# Patient Record
Sex: Female | Born: 1945 | Race: White | Hispanic: No
Health system: Southern US, Community
[De-identification: ages and names within clinical notes are randomized; demographics above are authoritative.]

## PROBLEM LIST (undated history)

## (undated) DIAGNOSIS — K219 Gastro-esophageal reflux disease without esophagitis: Secondary | ICD-10-CM

## (undated) DIAGNOSIS — C189 Malignant neoplasm of colon, unspecified: Secondary | ICD-10-CM

## (undated) DIAGNOSIS — R42 Dizziness and giddiness: Secondary | ICD-10-CM

## (undated) HISTORY — PX: LAPAROSCOPIC RIGHT COLON RESECTION: SHX1935

## (undated) HISTORY — PX: ABDOMINAL SURGERY: SHX537

## (undated) HISTORY — PX: TUBAL LIGATION: SHX77

## (undated) HISTORY — DX: Gastro-esophageal reflux disease without esophagitis: K21.9

## (undated) HISTORY — PX: COLON SURGERY: SHX602

## (undated) HISTORY — DX: Malignant neoplasm of colon, unspecified: C18.9

## (undated) HISTORY — DX: Dizziness and giddiness: R42

---

## 2000-02-10 ENCOUNTER — Other Ambulatory Visit: Admission: RE | Admit: 2000-02-10 | Discharge: 2000-02-10 | Payer: Self-pay | Admitting: Obstetrics and Gynecology

## 2000-04-22 ENCOUNTER — Ambulatory Visit (HOSPITAL_COMMUNITY): Admission: RE | Admit: 2000-04-22 | Discharge: 2000-04-22 | Payer: Self-pay | Admitting: Obstetrics and Gynecology

## 2000-07-30 ENCOUNTER — Ambulatory Visit (HOSPITAL_COMMUNITY): Admission: RE | Admit: 2000-07-30 | Discharge: 2000-07-30 | Payer: Self-pay | Admitting: Obstetrics and Gynecology

## 2000-07-30 ENCOUNTER — Encounter (INDEPENDENT_AMBULATORY_CARE_PROVIDER_SITE_OTHER): Payer: Self-pay

## 2001-03-15 ENCOUNTER — Ambulatory Visit (HOSPITAL_COMMUNITY): Admission: RE | Admit: 2001-03-15 | Discharge: 2001-03-15 | Payer: Self-pay | Admitting: Obstetrics and Gynecology

## 2001-03-15 ENCOUNTER — Encounter: Payer: Self-pay | Admitting: Obstetrics and Gynecology

## 2001-04-04 ENCOUNTER — Other Ambulatory Visit: Admission: RE | Admit: 2001-04-04 | Discharge: 2001-04-04 | Payer: Self-pay | Admitting: Obstetrics and Gynecology

## 2001-04-14 ENCOUNTER — Encounter: Admission: RE | Admit: 2001-04-14 | Discharge: 2001-04-14 | Payer: Self-pay | Admitting: Obstetrics and Gynecology

## 2001-04-14 ENCOUNTER — Encounter: Payer: Self-pay | Admitting: Obstetrics and Gynecology

## 2002-09-01 ENCOUNTER — Other Ambulatory Visit: Admission: RE | Admit: 2002-09-01 | Discharge: 2002-09-01 | Payer: Self-pay | Admitting: Obstetrics and Gynecology

## 2003-01-30 ENCOUNTER — Ambulatory Visit (HOSPITAL_COMMUNITY): Admission: RE | Admit: 2003-01-30 | Discharge: 2003-01-30 | Payer: Self-pay | Admitting: Obstetrics and Gynecology

## 2003-01-30 ENCOUNTER — Encounter: Payer: Self-pay | Admitting: Obstetrics and Gynecology

## 2003-10-16 ENCOUNTER — Other Ambulatory Visit: Admission: RE | Admit: 2003-10-16 | Discharge: 2003-10-16 | Payer: Self-pay | Admitting: Obstetrics and Gynecology

## 2004-05-13 ENCOUNTER — Ambulatory Visit (HOSPITAL_COMMUNITY): Admission: RE | Admit: 2004-05-13 | Discharge: 2004-05-13 | Payer: Self-pay | Admitting: Obstetrics and Gynecology

## 2006-05-13 ENCOUNTER — Encounter: Admission: RE | Admit: 2006-05-13 | Discharge: 2006-05-13 | Payer: Self-pay | Admitting: Obstetrics and Gynecology

## 2007-05-01 ENCOUNTER — Emergency Department (HOSPITAL_COMMUNITY): Admission: EM | Admit: 2007-05-01 | Discharge: 2007-05-01 | Payer: Self-pay | Admitting: Family Medicine

## 2010-12-28 ENCOUNTER — Encounter: Payer: Self-pay | Admitting: Obstetrics and Gynecology

## 2011-04-24 NOTE — Op Note (Signed)
[facility]  Patient:    Julie Luna, Julie Luna                       MRN: [id number] Proc. Date: 07/30/00 Adm. Date:  [id number] Disc. Date: [id number] Attending:  Lenoard Aden CC:         [facility]   Operative Report  PREOPERATIVE DIAGNOSIS:           Postmenopausal bleeding with endometrial mass on saline sonohysterography.  POSTOPERATIVE DIAGNOSIS:          Multiple endometrial polyps and submucous fibroid.  OPERATION/PROCEDURE:              1. Diagnostic hysteroscopy.                                   2. Dilatation and curettage.                                   3. Resectoscopic polypectomy.                                   4. Resectoscopic myomectomy.  SURGEON:                          Lenoard Aden, M.D.  ANESTHESIA:                       General by J. Leilani Able, Montez Hageman., M.D.  ESTIMATED BLOOD LOSS:             About 50 cc.  FLUID DEFICIT:                    Deficit 500 cc.  COMPLICATIONS:                    None.  COUNTS:                           Correct.  CONDITION:                        The patient was taken to the recovery room in good condition.  DESCRIPTION OF PROCEDURE:         After being appraised of the risks of anesthesia including infection, bleeding, uterine perforation with need for repair, injury to abdominal organs and need for repair, the patient was brought to the operating room and administered general anesthesia without complications and prepped and draped in the usual sterile fashion.  The patient had been catheterized and still bladder was empty.  Examined under anesthesia revealed laminaria which was removed.  There was anteflex uterus with grade 2 cystocele and grade 1 uterine descencus.  At this time dilute Pitressin solution 100 cc was placed at 3 and 9 oclock in the cervicovaginal junction.  The cervix was easily dilated up to a #31 Pratt dilator.  The hysteroscope was placed and  visualization revealed multiple anterior wall probable endometrial polyps and a posterior wall mass that impinges upon the fundus with consistency of a submucous fibroid.  The right angle loupe was entered and resection of the anterior wall mass done  without difficulty.  Good hemostasis was achieved.  Attention was turned to the posterior wall fibroid, which was removed in two passes using the right angle loupe.  At this time good hemostasis was achieved.  There was noted to be 500 cc fluid deficit; however, the drape was poorly applied to the patients buttocks and the sheet under the patient appears to be wet, accounting for some deficit.  The patient had no problems during anemia.  She was awakened and transferred to recovery in good condition. DD:  07/30/00 TD:  08/01/00 Job: [id number]

## 2011-09-14 ENCOUNTER — Other Ambulatory Visit: Payer: Self-pay | Admitting: Obstetrics and Gynecology

## 2011-09-14 DIAGNOSIS — Z1231 Encounter for screening mammogram for malignant neoplasm of breast: Secondary | ICD-10-CM

## 2011-10-22 ENCOUNTER — Ambulatory Visit
Admission: RE | Admit: 2011-10-22 | Discharge: 2011-10-22 | Disposition: A | Discharge status: Home / Self Care | Payer: BC Managed Care – PPO | Source: Ambulatory Visit | Attending: Obstetrics and Gynecology | Admitting: Obstetrics and Gynecology

## 2011-10-22 DIAGNOSIS — Z1231 Encounter for screening mammogram for malignant neoplasm of breast: Secondary | ICD-10-CM

## 2013-01-11 ENCOUNTER — Other Ambulatory Visit (INDEPENDENT_AMBULATORY_CARE_PROVIDER_SITE_OTHER): Payer: Self-pay | Admitting: Otolaryngology

## 2013-01-11 DIAGNOSIS — H9120 Sudden idiopathic hearing loss, unspecified ear: Secondary | ICD-10-CM

## 2013-01-16 ENCOUNTER — Ambulatory Visit
Admission: RE | Admit: 2013-01-16 | Discharge: 2013-01-16 | Disposition: A | Discharge status: Home / Self Care | Payer: BC Managed Care – PPO | Source: Ambulatory Visit | Attending: Otolaryngology | Admitting: Otolaryngology

## 2013-01-16 DIAGNOSIS — H9120 Sudden idiopathic hearing loss, unspecified ear: Secondary | ICD-10-CM

## 2013-01-16 MED ORDER — GADOBENATE DIMEGLUMINE 529 MG/ML IV SOLN
15.0000 mL | Freq: Once | INTRAVENOUS | Status: AC | PRN
Start: 2013-01-16 — End: 2013-01-16
  Administered 2013-01-16: 15 mL via INTRAVENOUS

## 2013-07-19 ENCOUNTER — Other Ambulatory Visit: Payer: Self-pay

## 2013-07-19 DIAGNOSIS — Z1231 Encounter for screening mammogram for malignant neoplasm of breast: Secondary | ICD-10-CM

## 2013-08-09 ENCOUNTER — Ambulatory Visit
Admission: RE | Admit: 2013-08-09 | Discharge: 2013-08-09 | Disposition: A | Discharge status: Home / Self Care | Payer: BC Managed Care – PPO | Source: Ambulatory Visit

## 2013-08-09 DIAGNOSIS — Z1231 Encounter for screening mammogram for malignant neoplasm of breast: Secondary | ICD-10-CM

## 2014-09-12 ENCOUNTER — Other Ambulatory Visit: Payer: Self-pay

## 2014-09-12 DIAGNOSIS — Z1231 Encounter for screening mammogram for malignant neoplasm of breast: Secondary | ICD-10-CM

## 2014-10-17 ENCOUNTER — Ambulatory Visit
Admission: RE | Admit: 2014-10-17 | Discharge: 2014-10-17 | Disposition: A | Discharge status: Home / Self Care | Payer: Medicare Other | Source: Ambulatory Visit

## 2014-10-17 DIAGNOSIS — Z1231 Encounter for screening mammogram for malignant neoplasm of breast: Secondary | ICD-10-CM

## 2015-09-11 ENCOUNTER — Other Ambulatory Visit: Payer: Self-pay

## 2015-09-11 DIAGNOSIS — Z1231 Encounter for screening mammogram for malignant neoplasm of breast: Secondary | ICD-10-CM

## 2015-10-25 ENCOUNTER — Other Ambulatory Visit: Payer: Self-pay | Admitting: Obstetrics and Gynecology

## 2015-10-25 DIAGNOSIS — E2839 Other primary ovarian failure: Secondary | ICD-10-CM

## 2015-11-06 ENCOUNTER — Ambulatory Visit
Admission: RE | Admit: 2015-11-06 | Discharge: 2015-11-06 | Disposition: A | Discharge status: Home / Self Care | Payer: Medicare Other | Source: Ambulatory Visit

## 2015-11-06 DIAGNOSIS — Z1231 Encounter for screening mammogram for malignant neoplasm of breast: Secondary | ICD-10-CM

## 2015-12-03 ENCOUNTER — Other Ambulatory Visit: Payer: Self-pay

## 2015-12-05 ENCOUNTER — Ambulatory Visit
Admission: RE | Admit: 2015-12-05 | Discharge: 2015-12-05 | Disposition: A | Discharge status: Home / Self Care | Payer: Medicare Other | Source: Ambulatory Visit | Attending: Obstetrics and Gynecology | Admitting: Obstetrics and Gynecology

## 2015-12-05 DIAGNOSIS — E2839 Other primary ovarian failure: Secondary | ICD-10-CM

## 2016-02-28 DIAGNOSIS — E785 Hyperlipidemia, unspecified: Secondary | ICD-10-CM | POA: Diagnosis not present

## 2016-02-28 DIAGNOSIS — I10 Essential (primary) hypertension: Secondary | ICD-10-CM | POA: Diagnosis not present

## 2016-02-28 DIAGNOSIS — F419 Anxiety disorder, unspecified: Secondary | ICD-10-CM | POA: Diagnosis not present

## 2016-02-28 DIAGNOSIS — R7309 Other abnormal glucose: Secondary | ICD-10-CM | POA: Diagnosis not present

## 2016-03-11 ENCOUNTER — Ambulatory Visit (INDEPENDENT_AMBULATORY_CARE_PROVIDER_SITE_OTHER): Payer: Medicare Other | Admitting: Podiatry

## 2016-03-11 VITALS — BP 124/78 | HR 62 | Resp 16

## 2016-03-11 DIAGNOSIS — B351 Tinea unguium: Secondary | ICD-10-CM | POA: Diagnosis not present

## 2016-03-11 MED ORDER — TERBINAFINE HCL 250 MG PO TABS: ORAL_TABLET | ORAL | Status: DC

## 2016-03-11 NOTE — Progress Notes (Signed)
Subjective:     Patient ID: Margreta JourneyDebra T Voight, female   DOB: 07-02-1946, 70 y.o.   MRN: [id number]  HPI patient presents stating that she has a lot of trouble with her big toenails on both feet her third nail on her right foot and that the big toenails are very yellow and a been present for a fairly long time but should not remember specific injury   Review of Systems  All other systems reviewed and are negative.      Objective:   Physical Exam  Constitutional: She is oriented to person, place, and time.  Cardiovascular: Intact distal pulses.   Musculoskeletal: Normal range of motion.  Neurological: She is oriented to person, place, and time.  Skin: Skin is warm.  Nursing note and vitals reviewed.  neurovascular status intact muscle strength adequate range of motion within normal limits with patient found to have thick and yellow brittle nails of the hallux bilateral that extends into the nail base. 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 Discussed treatment options at this point I have recommended combination of pulse Lamisil laser topical treatments. I educated her there is no guarantee this will solve her problem and she wants to undergo this treatment protocol and is scheduled for laser and we'll begin the oral medication and topical medicines

## 2016-03-11 NOTE — Progress Notes (Signed)
   Subjective:    Patient ID: Julie JourneyDebra T Hollinshead, female    DOB: [birth year], 70 y.o.   MRN: [id number]  HPI    Review of Systems  All other systems reviewed and are negative.      Objective:   Physical Exam        Assessment & Plan:

## 2016-03-19 ENCOUNTER — Ambulatory Visit: Payer: Medicare Other

## 2016-03-19 DIAGNOSIS — B351 Tinea unguium: Secondary | ICD-10-CM

## 2016-03-19 NOTE — Progress Notes (Signed)
   Subjective:    Patient ID: Julie JourneyDebra T Hammar, female    DOB: [birth year], 70 y.o.   MRN: [id number]  HPI  Pt presents with mycotic nail infection of Rt 1,3,4 and Lt 1, 5, she is here today for Laser treatment #1  Review of Systems    all other systems negative Objective:   Physical Exam    Mycotic nail infection bilateral     Assessment & Plan:  Laser administered today approximately 2000 pulses to affected nails. Tolerated well. All safety precautions in place. Re-appointed in 1 month for 2nd laser treatment

## 2016-04-01 DIAGNOSIS — L820 Inflamed seborrheic keratosis: Secondary | ICD-10-CM | POA: Diagnosis not present

## 2016-04-08 DIAGNOSIS — H7201 Central perforation of tympanic membrane, right ear: Secondary | ICD-10-CM | POA: Diagnosis not present

## 2016-04-08 DIAGNOSIS — H6123 Impacted cerumen, bilateral: Secondary | ICD-10-CM | POA: Diagnosis not present

## 2016-04-08 DIAGNOSIS — H9011 Conductive hearing loss, unilateral, right ear, with unrestricted hearing on the contralateral side: Secondary | ICD-10-CM | POA: Diagnosis not present

## 2016-04-16 ENCOUNTER — Ambulatory Visit (INDEPENDENT_AMBULATORY_CARE_PROVIDER_SITE_OTHER): Payer: Medicare Other

## 2016-04-16 DIAGNOSIS — B351 Tinea unguium: Secondary | ICD-10-CM | POA: Diagnosis not present

## 2016-04-16 LAB — HEPATIC FUNCTION PANEL
ALK PHOS: 46 U/L (ref 33–130)
ALT: 10 U/L (ref 6–29)
AST: 12 U/L (ref 10–35)
Albumin: 4.1 g/dL (ref 3.6–5.1)
BILIRUBIN DIRECT: 0.1 mg/dL (ref <=0.2)
BILIRUBIN TOTAL: 0.6 mg/dL (ref 0.2–1.2)
Indirect Bilirubin: 0.5 mg/dL (ref 0.2–1.2)
Total Protein: 6.2 g/dL (ref 6.1–8.1)

## 2016-04-16 NOTE — Progress Notes (Signed)
   Subjective:    Patient ID: Julie Luna, female    DOB: 1946-04-18, 70 y.o.   MRN: [id number]  HPI  Pt presents with mycotic nail infection of Rt 1,3,4 and Lt 1, 5, she is here today for Laser treatment #1  Review of Systems    all other systems negative Objective:   Physical Exam    Mycotic nail infection bilateral     Assessment & Plan:  Laser administered today approximately 2000 pulses to affected nails.  Tolerated well. All safety precautions in place. Hepatic fx panel ordered today. Re-appointed in 2 months for 3rd laser treatment

## 2016-04-17 ENCOUNTER — Telehealth: Payer: Self-pay | Admitting: *Deleted

## 2016-04-17 NOTE — Telephone Encounter (Signed)
Dr. Charlsie Merlesegal reviewed 04/16/2016 Hepatic function as normal and pt can take her medication as directed.  Left message informing pt of orders.

## 2016-05-01 DIAGNOSIS — H02834 Dermatochalasis of left upper eyelid: Secondary | ICD-10-CM | POA: Diagnosis not present

## 2016-05-01 DIAGNOSIS — H04123 Dry eye syndrome of bilateral lacrimal glands: Secondary | ICD-10-CM | POA: Diagnosis not present

## 2016-05-01 DIAGNOSIS — H02831 Dermatochalasis of right upper eyelid: Secondary | ICD-10-CM | POA: Diagnosis not present

## 2016-05-01 DIAGNOSIS — H25813 Combined forms of age-related cataract, bilateral: Secondary | ICD-10-CM | POA: Diagnosis not present

## 2016-06-18 ENCOUNTER — Other Ambulatory Visit (INDEPENDENT_AMBULATORY_CARE_PROVIDER_SITE_OTHER): Payer: Medicare Other

## 2016-06-18 DIAGNOSIS — B351 Tinea unguium: Secondary | ICD-10-CM

## 2016-07-30 ENCOUNTER — Ambulatory Visit: Payer: Medicare Other

## 2016-07-30 DIAGNOSIS — B351 Tinea unguium: Secondary | ICD-10-CM

## 2016-07-30 NOTE — Progress Notes (Signed)
   Subjective:    Patient ID: Margreta JourneyDebra T Maricle, female    DOB: 12-24-1945, 70 y.o.   MRN: [id number]  HPI  Pt presents with mycotic nail infection of Rt 1,3, and Lt 1, ,she is here today for Laser treatment #4  Review of Systems    all other systems negative Objective:   Physical Exam    Mycotic nail infection bilateral with distal tip involvement. Clearing at 90%     Assessment & Plan:  Laser administered today approximately 2000 pulses to affected nails.  Tolerated well. All safety precautions in place. Re-appointed as needed

## 2016-09-01 DIAGNOSIS — Z1211 Encounter for screening for malignant neoplasm of colon: Secondary | ICD-10-CM | POA: Diagnosis not present

## 2016-09-01 DIAGNOSIS — Z23 Encounter for immunization: Secondary | ICD-10-CM | POA: Diagnosis not present

## 2016-09-01 DIAGNOSIS — R7309 Other abnormal glucose: Secondary | ICD-10-CM | POA: Diagnosis not present

## 2016-09-01 DIAGNOSIS — E7800 Pure hypercholesterolemia, unspecified: Secondary | ICD-10-CM | POA: Diagnosis not present

## 2016-09-01 DIAGNOSIS — F419 Anxiety disorder, unspecified: Secondary | ICD-10-CM | POA: Diagnosis not present

## 2016-09-01 DIAGNOSIS — I10 Essential (primary) hypertension: Secondary | ICD-10-CM | POA: Diagnosis not present

## 2016-11-11 DIAGNOSIS — H6123 Impacted cerumen, bilateral: Secondary | ICD-10-CM | POA: Diagnosis not present

## 2016-11-11 DIAGNOSIS — H7201 Central perforation of tympanic membrane, right ear: Secondary | ICD-10-CM | POA: Diagnosis not present

## 2016-11-11 DIAGNOSIS — H9011 Conductive hearing loss, unilateral, right ear, with unrestricted hearing on the contralateral side: Secondary | ICD-10-CM | POA: Diagnosis not present

## 2016-11-13 DIAGNOSIS — Z86010 Personal history of colonic polyps: Secondary | ICD-10-CM | POA: Diagnosis not present

## 2017-01-28 DIAGNOSIS — K5730 Diverticulosis of large intestine without perforation or abscess without bleeding: Secondary | ICD-10-CM | POA: Diagnosis not present

## 2017-01-28 DIAGNOSIS — D126 Benign neoplasm of colon, unspecified: Secondary | ICD-10-CM | POA: Diagnosis not present

## 2017-01-28 DIAGNOSIS — Z86010 Personal history of colonic polyps: Secondary | ICD-10-CM | POA: Diagnosis not present

## 2017-01-28 DIAGNOSIS — K648 Other hemorrhoids: Secondary | ICD-10-CM | POA: Diagnosis not present

## 2017-02-02 DIAGNOSIS — D126 Benign neoplasm of colon, unspecified: Secondary | ICD-10-CM | POA: Diagnosis not present

## 2017-02-10 DIAGNOSIS — Z86010 Personal history of colonic polyps: Secondary | ICD-10-CM | POA: Diagnosis not present

## 2017-03-02 DIAGNOSIS — F419 Anxiety disorder, unspecified: Secondary | ICD-10-CM | POA: Diagnosis not present

## 2017-03-02 DIAGNOSIS — I10 Essential (primary) hypertension: Secondary | ICD-10-CM | POA: Diagnosis not present

## 2017-03-02 DIAGNOSIS — R7303 Prediabetes: Secondary | ICD-10-CM | POA: Diagnosis not present

## 2017-03-02 DIAGNOSIS — E7800 Pure hypercholesterolemia, unspecified: Secondary | ICD-10-CM | POA: Diagnosis not present

## 2017-03-23 DIAGNOSIS — H6123 Impacted cerumen, bilateral: Secondary | ICD-10-CM | POA: Diagnosis not present

## 2017-03-23 DIAGNOSIS — H9071 Mixed conductive and sensorineural hearing loss, unilateral, right ear, with unrestricted hearing on the contralateral side: Secondary | ICD-10-CM | POA: Diagnosis not present

## 2017-03-23 DIAGNOSIS — J310 Chronic rhinitis: Secondary | ICD-10-CM | POA: Diagnosis not present

## 2017-04-23 DIAGNOSIS — Z7982 Long term (current) use of aspirin: Secondary | ICD-10-CM | POA: Diagnosis not present

## 2017-04-23 DIAGNOSIS — Z79899 Other long term (current) drug therapy: Secondary | ICD-10-CM | POA: Diagnosis not present

## 2017-04-23 DIAGNOSIS — D122 Benign neoplasm of ascending colon: Secondary | ICD-10-CM | POA: Diagnosis not present

## 2017-04-23 DIAGNOSIS — K635 Polyp of colon: Secondary | ICD-10-CM | POA: Diagnosis not present

## 2017-04-23 DIAGNOSIS — I10 Essential (primary) hypertension: Secondary | ICD-10-CM | POA: Diagnosis not present

## 2017-04-23 DIAGNOSIS — K5730 Diverticulosis of large intestine without perforation or abscess without bleeding: Secondary | ICD-10-CM | POA: Diagnosis not present

## 2017-05-07 DIAGNOSIS — D122 Benign neoplasm of ascending colon: Secondary | ICD-10-CM | POA: Insufficient documentation

## 2017-05-25 DIAGNOSIS — I10 Essential (primary) hypertension: Secondary | ICD-10-CM | POA: Insufficient documentation

## 2017-05-25 DIAGNOSIS — F419 Anxiety disorder, unspecified: Secondary | ICD-10-CM | POA: Insufficient documentation

## 2017-05-25 DIAGNOSIS — J302 Other seasonal allergic rhinitis: Secondary | ICD-10-CM | POA: Insufficient documentation

## 2017-05-25 DIAGNOSIS — D122 Benign neoplasm of ascending colon: Secondary | ICD-10-CM | POA: Diagnosis not present

## 2017-05-25 DIAGNOSIS — K219 Gastro-esophageal reflux disease without esophagitis: Secondary | ICD-10-CM | POA: Insufficient documentation

## 2017-06-21 DIAGNOSIS — K219 Gastro-esophageal reflux disease without esophagitis: Secondary | ICD-10-CM | POA: Diagnosis not present

## 2017-06-21 DIAGNOSIS — I10 Essential (primary) hypertension: Secondary | ICD-10-CM | POA: Diagnosis not present

## 2017-06-21 DIAGNOSIS — E785 Hyperlipidemia, unspecified: Secondary | ICD-10-CM | POA: Diagnosis not present

## 2017-06-21 DIAGNOSIS — F419 Anxiety disorder, unspecified: Secondary | ICD-10-CM | POA: Diagnosis not present

## 2017-06-21 DIAGNOSIS — D122 Benign neoplasm of ascending colon: Secondary | ICD-10-CM | POA: Diagnosis not present

## 2017-06-21 DIAGNOSIS — C182 Malignant neoplasm of ascending colon: Secondary | ICD-10-CM | POA: Diagnosis not present

## 2017-06-21 DIAGNOSIS — H9191 Unspecified hearing loss, right ear: Secondary | ICD-10-CM | POA: Diagnosis not present

## 2017-07-09 DIAGNOSIS — H903 Sensorineural hearing loss, bilateral: Secondary | ICD-10-CM | POA: Diagnosis not present

## 2017-07-09 DIAGNOSIS — H6123 Impacted cerumen, bilateral: Secondary | ICD-10-CM | POA: Diagnosis not present

## 2017-08-17 DIAGNOSIS — C182 Malignant neoplasm of ascending colon: Secondary | ICD-10-CM | POA: Insufficient documentation

## 2017-08-25 DIAGNOSIS — I10 Essential (primary) hypertension: Secondary | ICD-10-CM | POA: Diagnosis not present

## 2017-08-25 DIAGNOSIS — E7800 Pure hypercholesterolemia, unspecified: Secondary | ICD-10-CM | POA: Diagnosis not present

## 2017-08-25 DIAGNOSIS — R7303 Prediabetes: Secondary | ICD-10-CM | POA: Diagnosis not present

## 2017-08-25 DIAGNOSIS — F419 Anxiety disorder, unspecified: Secondary | ICD-10-CM | POA: Diagnosis not present

## 2018-01-11 DIAGNOSIS — H6123 Impacted cerumen, bilateral: Secondary | ICD-10-CM | POA: Diagnosis not present

## 2018-01-11 DIAGNOSIS — H9041 Sensorineural hearing loss, unilateral, right ear, with unrestricted hearing on the contralateral side: Secondary | ICD-10-CM | POA: Diagnosis not present

## 2018-02-09 DIAGNOSIS — C182 Malignant neoplasm of ascending colon: Secondary | ICD-10-CM | POA: Diagnosis not present

## 2018-02-09 DIAGNOSIS — D122 Benign neoplasm of ascending colon: Secondary | ICD-10-CM | POA: Diagnosis not present

## 2018-02-23 DIAGNOSIS — E7800 Pure hypercholesterolemia, unspecified: Secondary | ICD-10-CM | POA: Diagnosis not present

## 2018-02-23 DIAGNOSIS — R7303 Prediabetes: Secondary | ICD-10-CM | POA: Diagnosis not present

## 2018-02-23 DIAGNOSIS — F419 Anxiety disorder, unspecified: Secondary | ICD-10-CM | POA: Diagnosis not present

## 2018-02-23 DIAGNOSIS — I10 Essential (primary) hypertension: Secondary | ICD-10-CM | POA: Diagnosis not present

## 2018-05-26 DIAGNOSIS — L820 Inflamed seborrheic keratosis: Secondary | ICD-10-CM | POA: Diagnosis not present

## 2018-05-26 DIAGNOSIS — L819 Disorder of pigmentation, unspecified: Secondary | ICD-10-CM | POA: Diagnosis not present

## 2018-05-26 DIAGNOSIS — D485 Neoplasm of uncertain behavior of skin: Secondary | ICD-10-CM | POA: Diagnosis not present

## 2018-06-03 DIAGNOSIS — Z9049 Acquired absence of other specified parts of digestive tract: Secondary | ICD-10-CM | POA: Diagnosis not present

## 2018-06-03 DIAGNOSIS — D124 Benign neoplasm of descending colon: Secondary | ICD-10-CM | POA: Diagnosis not present

## 2018-06-03 DIAGNOSIS — K635 Polyp of colon: Secondary | ICD-10-CM | POA: Diagnosis not present

## 2018-06-03 DIAGNOSIS — Z1211 Encounter for screening for malignant neoplasm of colon: Secondary | ICD-10-CM | POA: Diagnosis not present

## 2018-06-03 DIAGNOSIS — Z85038 Personal history of other malignant neoplasm of large intestine: Secondary | ICD-10-CM | POA: Diagnosis not present

## 2018-06-03 DIAGNOSIS — K5730 Diverticulosis of large intestine without perforation or abscess without bleeding: Secondary | ICD-10-CM | POA: Diagnosis not present

## 2018-07-12 DIAGNOSIS — H9041 Sensorineural hearing loss, unilateral, right ear, with unrestricted hearing on the contralateral side: Secondary | ICD-10-CM | POA: Diagnosis not present

## 2018-07-12 DIAGNOSIS — H6123 Impacted cerumen, bilateral: Secondary | ICD-10-CM | POA: Diagnosis not present

## 2018-07-28 ENCOUNTER — Other Ambulatory Visit: Payer: Self-pay | Admitting: Family Medicine

## 2018-07-28 DIAGNOSIS — Z1231 Encounter for screening mammogram for malignant neoplasm of breast: Secondary | ICD-10-CM

## 2018-08-30 ENCOUNTER — Ambulatory Visit
Admission: RE | Admit: 2018-08-30 | Discharge: 2018-08-30 | Disposition: A | Discharge status: Home / Self Care | Payer: Medicare Other | Source: Ambulatory Visit | Attending: Family Medicine | Admitting: Family Medicine

## 2018-08-30 DIAGNOSIS — Z1231 Encounter for screening mammogram for malignant neoplasm of breast: Secondary | ICD-10-CM

## 2018-08-31 DIAGNOSIS — Z23 Encounter for immunization: Secondary | ICD-10-CM | POA: Diagnosis not present

## 2018-08-31 DIAGNOSIS — E7800 Pure hypercholesterolemia, unspecified: Secondary | ICD-10-CM | POA: Diagnosis not present

## 2018-08-31 DIAGNOSIS — R7303 Prediabetes: Secondary | ICD-10-CM | POA: Diagnosis not present

## 2018-08-31 DIAGNOSIS — I10 Essential (primary) hypertension: Secondary | ICD-10-CM | POA: Diagnosis not present

## 2018-09-28 DIAGNOSIS — Z85038 Personal history of other malignant neoplasm of large intestine: Secondary | ICD-10-CM | POA: Diagnosis not present

## 2018-09-28 DIAGNOSIS — C182 Malignant neoplasm of ascending colon: Secondary | ICD-10-CM | POA: Diagnosis not present

## 2018-09-28 DIAGNOSIS — Z9049 Acquired absence of other specified parts of digestive tract: Secondary | ICD-10-CM | POA: Diagnosis not present

## 2018-09-30 DIAGNOSIS — H7201 Central perforation of tympanic membrane, right ear: Secondary | ICD-10-CM | POA: Diagnosis not present

## 2018-09-30 DIAGNOSIS — H9312 Tinnitus, left ear: Secondary | ICD-10-CM | POA: Diagnosis not present

## 2018-09-30 DIAGNOSIS — H6983 Other specified disorders of Eustachian tube, bilateral: Secondary | ICD-10-CM | POA: Diagnosis not present

## 2018-10-11 DIAGNOSIS — H25813 Combined forms of age-related cataract, bilateral: Secondary | ICD-10-CM | POA: Diagnosis not present

## 2018-10-11 DIAGNOSIS — H04123 Dry eye syndrome of bilateral lacrimal glands: Secondary | ICD-10-CM | POA: Diagnosis not present

## 2018-10-11 DIAGNOSIS — H02834 Dermatochalasis of left upper eyelid: Secondary | ICD-10-CM | POA: Diagnosis not present

## 2018-10-11 DIAGNOSIS — H02831 Dermatochalasis of right upper eyelid: Secondary | ICD-10-CM | POA: Diagnosis not present

## 2019-01-10 DIAGNOSIS — H6123 Impacted cerumen, bilateral: Secondary | ICD-10-CM | POA: Diagnosis not present

## 2019-03-08 DIAGNOSIS — E7800 Pure hypercholesterolemia, unspecified: Secondary | ICD-10-CM | POA: Diagnosis not present

## 2019-03-08 DIAGNOSIS — Z0000 Encounter for general adult medical examination without abnormal findings: Secondary | ICD-10-CM | POA: Diagnosis not present

## 2019-03-08 DIAGNOSIS — R7303 Prediabetes: Secondary | ICD-10-CM | POA: Diagnosis not present

## 2019-03-08 DIAGNOSIS — I10 Essential (primary) hypertension: Secondary | ICD-10-CM | POA: Diagnosis not present

## 2019-03-22 DIAGNOSIS — C182 Malignant neoplasm of ascending colon: Secondary | ICD-10-CM | POA: Diagnosis not present

## 2019-03-24 DIAGNOSIS — K219 Gastro-esophageal reflux disease without esophagitis: Secondary | ICD-10-CM | POA: Diagnosis not present

## 2019-03-24 DIAGNOSIS — D122 Benign neoplasm of ascending colon: Secondary | ICD-10-CM | POA: Diagnosis not present

## 2019-03-29 DIAGNOSIS — E7800 Pure hypercholesterolemia, unspecified: Secondary | ICD-10-CM | POA: Diagnosis not present

## 2019-03-29 DIAGNOSIS — I10 Essential (primary) hypertension: Secondary | ICD-10-CM | POA: Diagnosis not present

## 2019-03-29 DIAGNOSIS — R7303 Prediabetes: Secondary | ICD-10-CM | POA: Diagnosis not present

## 2019-07-12 DIAGNOSIS — H9041 Sensorineural hearing loss, unilateral, right ear, with unrestricted hearing on the contralateral side: Secondary | ICD-10-CM | POA: Diagnosis not present

## 2019-07-12 DIAGNOSIS — H6123 Impacted cerumen, bilateral: Secondary | ICD-10-CM | POA: Diagnosis not present

## 2019-09-12 DIAGNOSIS — F419 Anxiety disorder, unspecified: Secondary | ICD-10-CM | POA: Diagnosis not present

## 2019-09-12 DIAGNOSIS — E7800 Pure hypercholesterolemia, unspecified: Secondary | ICD-10-CM | POA: Diagnosis not present

## 2019-09-12 DIAGNOSIS — I10 Essential (primary) hypertension: Secondary | ICD-10-CM | POA: Diagnosis not present

## 2019-09-12 DIAGNOSIS — R7303 Prediabetes: Secondary | ICD-10-CM | POA: Diagnosis not present

## 2019-10-03 DIAGNOSIS — Z23 Encounter for immunization: Secondary | ICD-10-CM | POA: Diagnosis not present

## 2019-10-17 DIAGNOSIS — H02834 Dermatochalasis of left upper eyelid: Secondary | ICD-10-CM | POA: Diagnosis not present

## 2019-10-17 DIAGNOSIS — H04123 Dry eye syndrome of bilateral lacrimal glands: Secondary | ICD-10-CM | POA: Diagnosis not present

## 2019-10-17 DIAGNOSIS — H25813 Combined forms of age-related cataract, bilateral: Secondary | ICD-10-CM | POA: Diagnosis not present

## 2019-10-17 DIAGNOSIS — H02831 Dermatochalasis of right upper eyelid: Secondary | ICD-10-CM | POA: Diagnosis not present

## 2020-01-10 DIAGNOSIS — H6123 Impacted cerumen, bilateral: Secondary | ICD-10-CM | POA: Diagnosis not present

## 2020-02-12 ENCOUNTER — Other Ambulatory Visit: Payer: Self-pay | Admitting: Family Medicine

## 2020-02-12 DIAGNOSIS — Z1231 Encounter for screening mammogram for malignant neoplasm of breast: Secondary | ICD-10-CM

## 2020-03-12 ENCOUNTER — Other Ambulatory Visit: Payer: Self-pay

## 2020-03-12 ENCOUNTER — Ambulatory Visit
Admission: RE | Admit: 2020-03-12 | Discharge: 2020-03-12 | Disposition: A | Discharge status: Home / Self Care | Payer: Medicare Other | Source: Ambulatory Visit | Attending: Family Medicine | Admitting: Family Medicine

## 2020-03-12 DIAGNOSIS — Z1231 Encounter for screening mammogram for malignant neoplasm of breast: Secondary | ICD-10-CM

## 2020-03-21 ENCOUNTER — Other Ambulatory Visit: Payer: Self-pay | Admitting: Family Medicine

## 2020-03-21 DIAGNOSIS — I10 Essential (primary) hypertension: Secondary | ICD-10-CM | POA: Diagnosis not present

## 2020-03-21 DIAGNOSIS — R7303 Prediabetes: Secondary | ICD-10-CM | POA: Diagnosis not present

## 2020-03-21 DIAGNOSIS — Z85038 Personal history of other malignant neoplasm of large intestine: Secondary | ICD-10-CM | POA: Diagnosis not present

## 2020-03-21 DIAGNOSIS — Z0000 Encounter for general adult medical examination without abnormal findings: Secondary | ICD-10-CM | POA: Diagnosis not present

## 2020-03-21 DIAGNOSIS — R102 Pelvic and perineal pain: Secondary | ICD-10-CM

## 2020-03-21 DIAGNOSIS — E7800 Pure hypercholesterolemia, unspecified: Secondary | ICD-10-CM | POA: Diagnosis not present

## 2020-04-08 ENCOUNTER — Ambulatory Visit
Admission: RE | Admit: 2020-04-08 | Discharge: 2020-04-08 | Disposition: A | Discharge status: Home / Self Care | Payer: Medicare Other | Source: Ambulatory Visit | Attending: Family Medicine | Admitting: Family Medicine

## 2020-04-08 ENCOUNTER — Other Ambulatory Visit: Payer: Self-pay

## 2020-04-08 DIAGNOSIS — R109 Unspecified abdominal pain: Secondary | ICD-10-CM

## 2020-04-08 DIAGNOSIS — R102 Pelvic and perineal pain: Secondary | ICD-10-CM

## 2020-04-08 MED ORDER — IOPAMIDOL (ISOVUE-300) INJECTION 61%
100.0000 mL | Freq: Once | INTRAVENOUS | Status: AC | PRN
  Administered 2020-04-08: 100 mL via INTRAVENOUS

## 2020-07-08 IMAGING — MG DIGITAL SCREENING BILATERAL MAMMOGRAM WITH TOMO AND CAD
8 series · 9 of 24 positions shown · non-contrast
Comparison: Previous exam(s).

ACR Breast Density Category a: The breast tissue is almost entirely
fatty.

CLINICAL DATA: Screening.

EXAM:
DIGITAL SCREENING BILATERAL MAMMOGRAM WITH TOMO AND CAD

[R CC synth-2D]
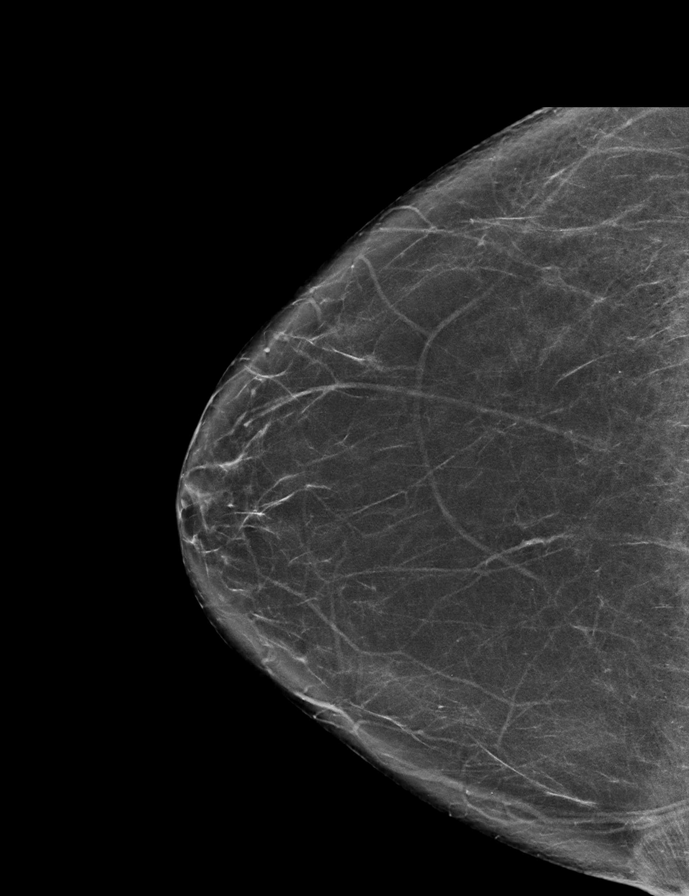

[L MLO synth-2D]
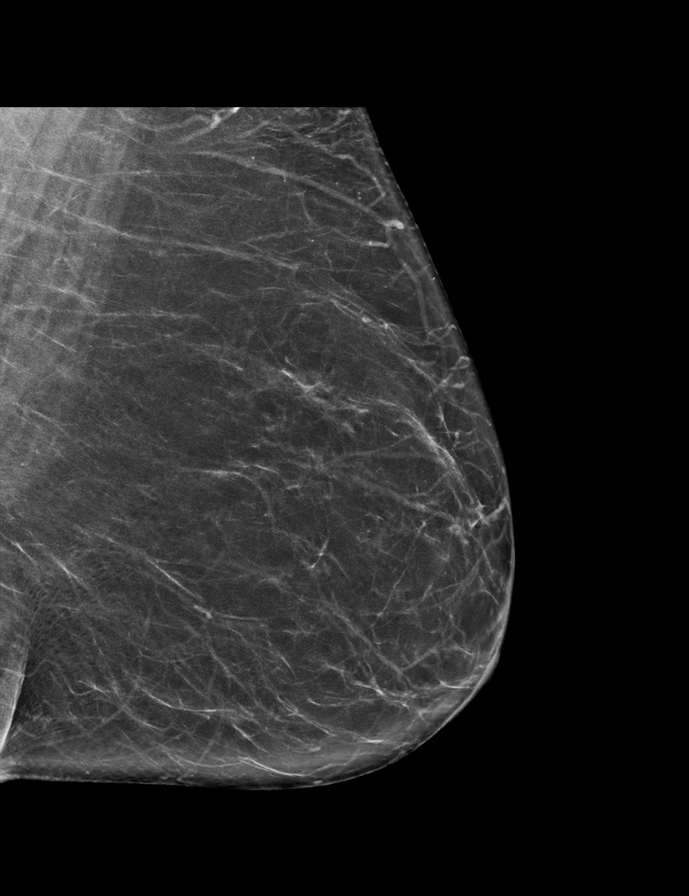

[R MLO synth-2D]
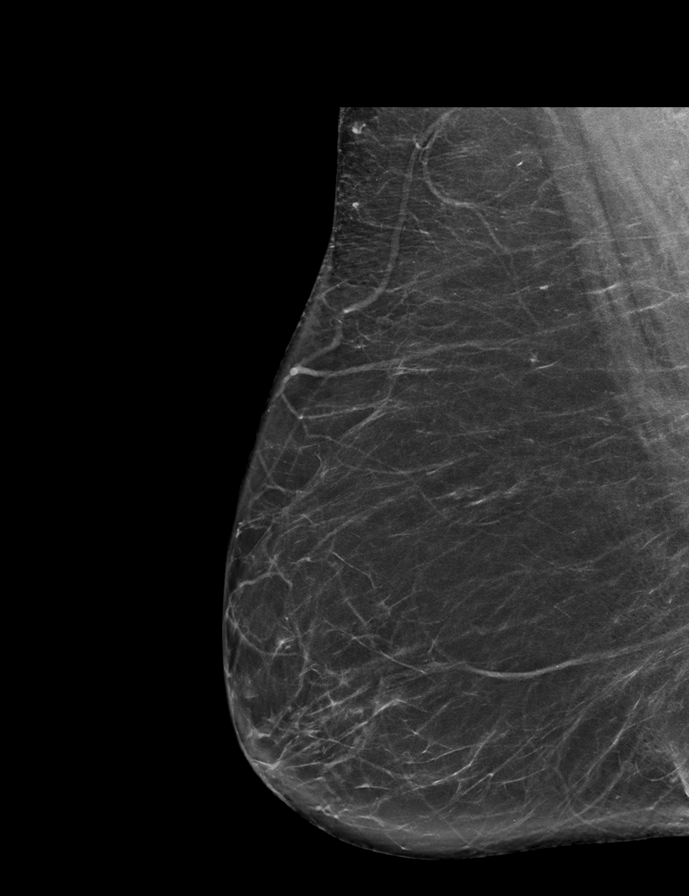

[L CC synth-2D]
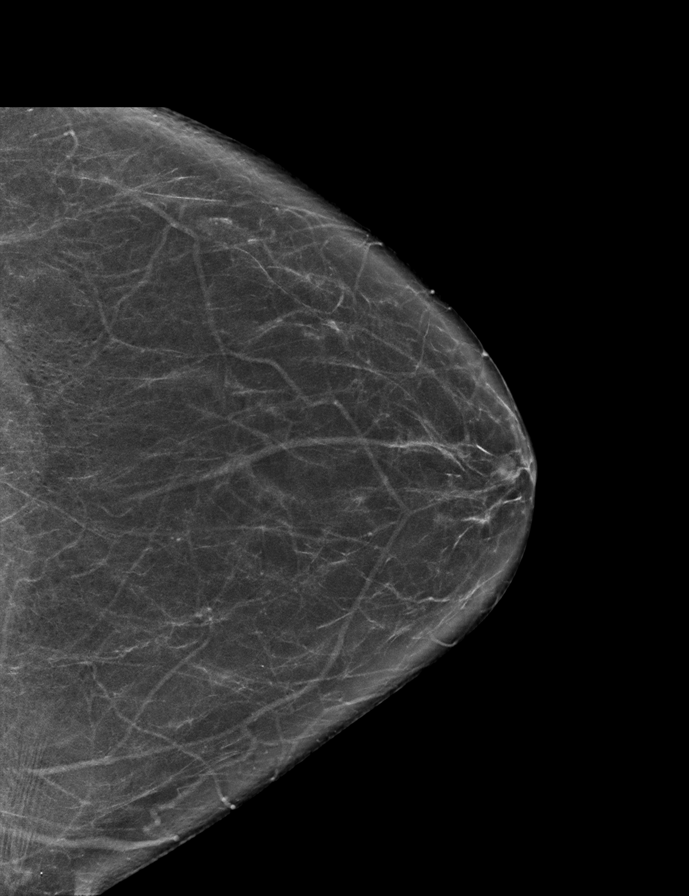

[L CC tomo · 2 of 66 frames shown]
[frame 22/66]
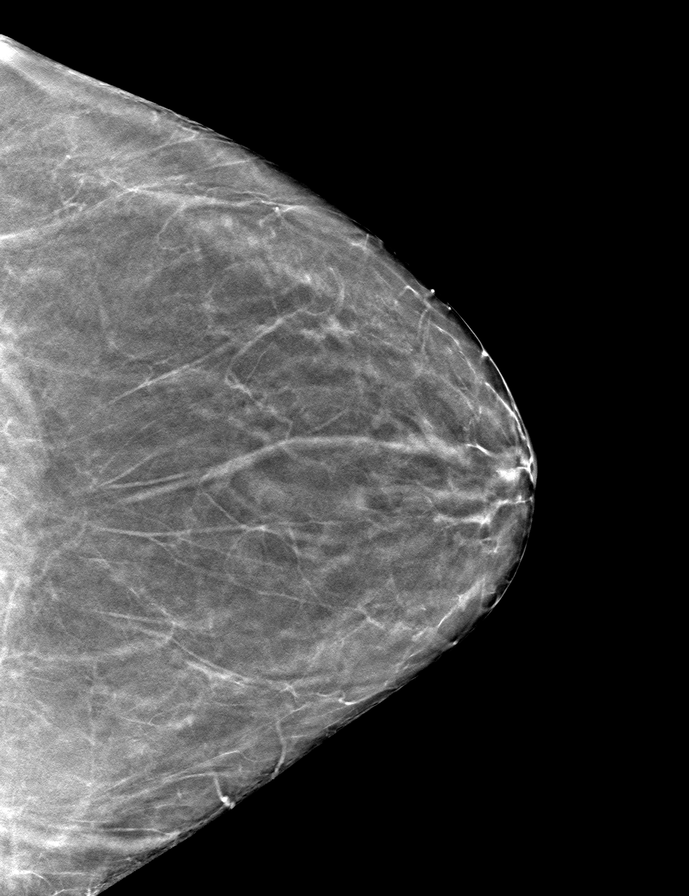
[frame 33/66]
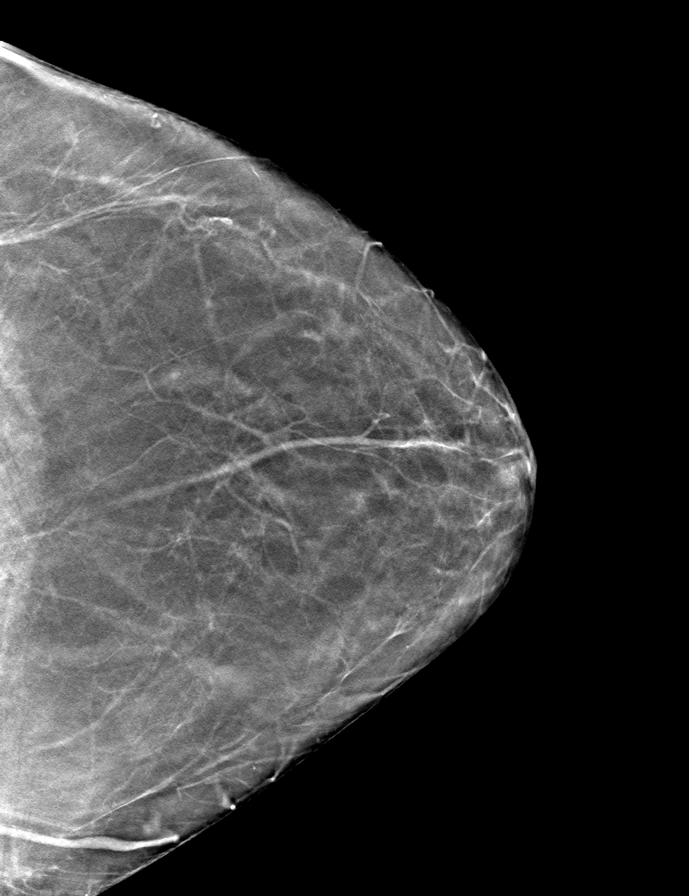

[L MLO tomo · tomo slice 35/68.0]
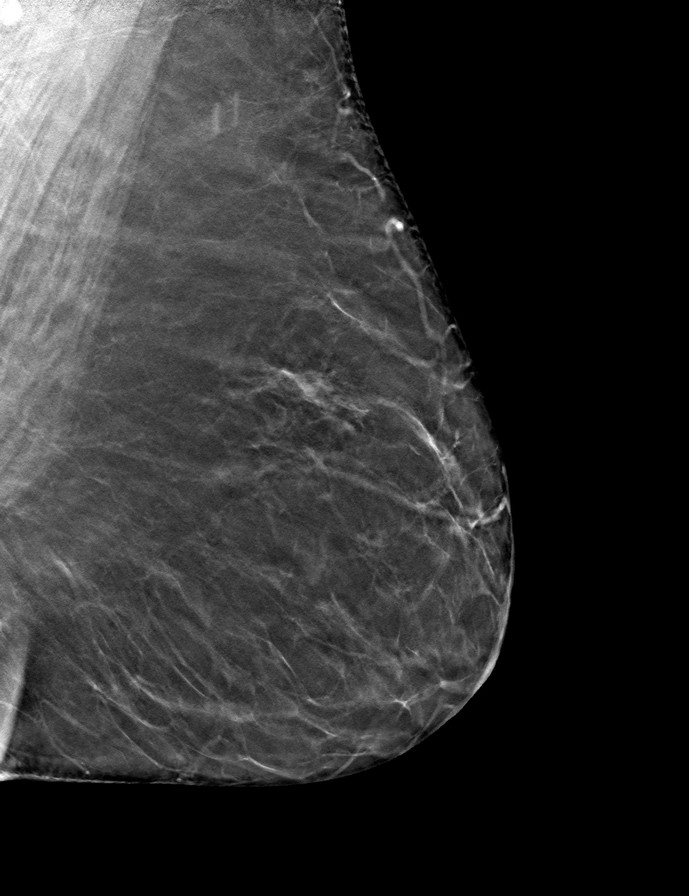

[R CC tomo · tomo slice 35/70.0]
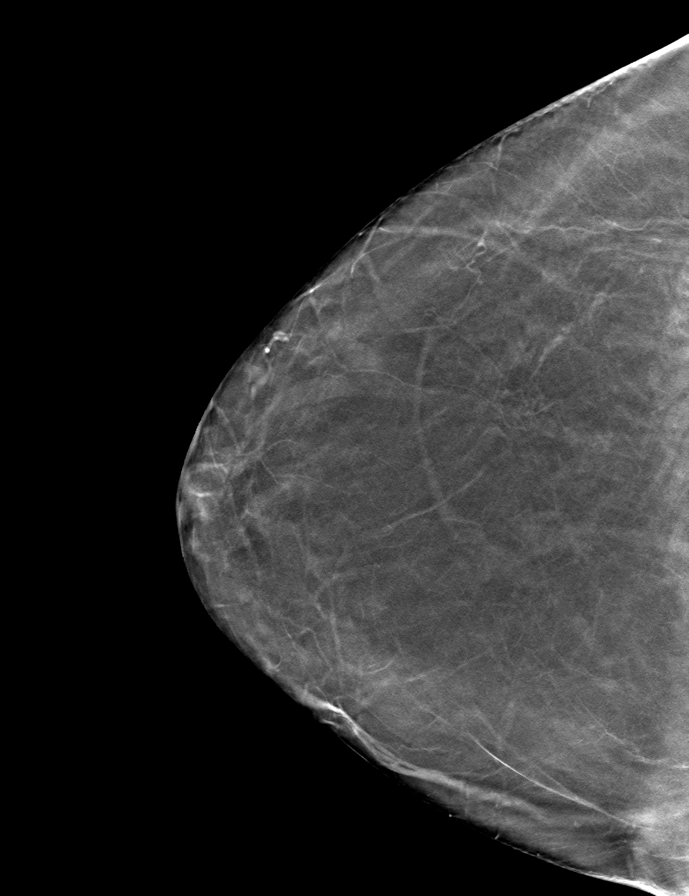

[R MLO tomo · tomo slice 38/75.0]
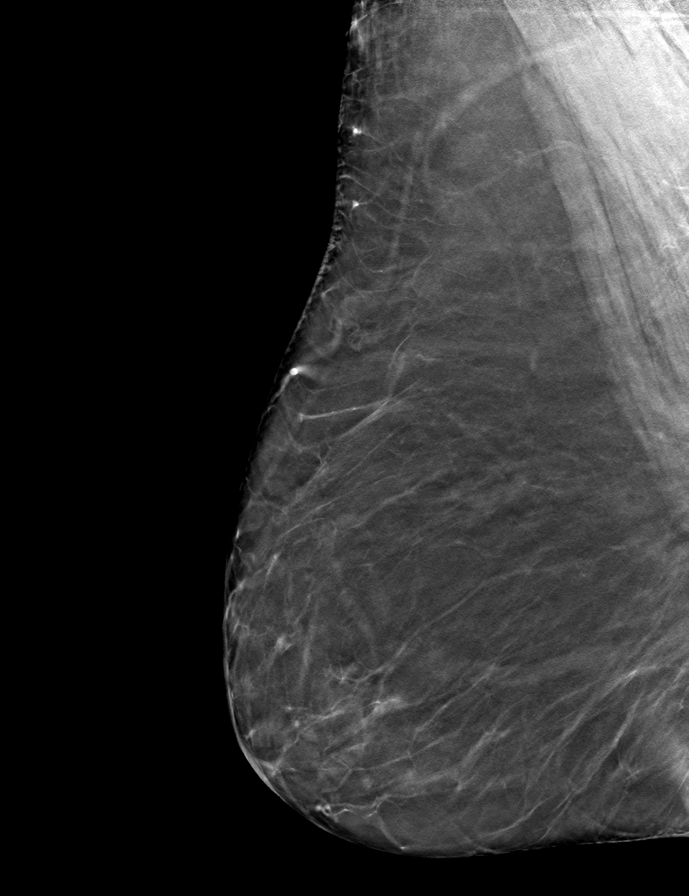

[9 of 24 positions shown; findings below may reference images not displayed]

FINDINGS: There are no findings suspicious for malignancy. Images were
processed with CAD.
IMPRESSION: No mammographic evidence of malignancy. A result letter of this
screening mammogram will be mailed directly to the patient.

RECOMMENDATION:
Screening mammogram in one year. (Code:8Y-[id number])

BI-RADS CATEGORY  1: Negative.

## 2020-07-10 DIAGNOSIS — H9041 Sensorineural hearing loss, unilateral, right ear, with unrestricted hearing on the contralateral side: Secondary | ICD-10-CM | POA: Diagnosis not present

## 2020-07-10 DIAGNOSIS — H6123 Impacted cerumen, bilateral: Secondary | ICD-10-CM | POA: Diagnosis not present

## 2020-09-17 DIAGNOSIS — I10 Essential (primary) hypertension: Secondary | ICD-10-CM | POA: Diagnosis not present

## 2020-09-17 DIAGNOSIS — F419 Anxiety disorder, unspecified: Secondary | ICD-10-CM | POA: Diagnosis not present

## 2020-09-17 DIAGNOSIS — E7800 Pure hypercholesterolemia, unspecified: Secondary | ICD-10-CM | POA: Diagnosis not present

## 2020-09-17 DIAGNOSIS — R7303 Prediabetes: Secondary | ICD-10-CM | POA: Diagnosis not present

## 2020-10-17 DIAGNOSIS — H02831 Dermatochalasis of right upper eyelid: Secondary | ICD-10-CM | POA: Diagnosis not present

## 2020-10-17 DIAGNOSIS — H25813 Combined forms of age-related cataract, bilateral: Secondary | ICD-10-CM | POA: Diagnosis not present

## 2020-10-17 DIAGNOSIS — H04123 Dry eye syndrome of bilateral lacrimal glands: Secondary | ICD-10-CM | POA: Diagnosis not present

## 2020-10-17 DIAGNOSIS — H02834 Dermatochalasis of left upper eyelid: Secondary | ICD-10-CM | POA: Diagnosis not present

## 2021-01-07 DIAGNOSIS — H6123 Impacted cerumen, bilateral: Secondary | ICD-10-CM | POA: Diagnosis not present

## 2021-06-18 DIAGNOSIS — Z0000 Encounter for general adult medical examination without abnormal findings: Secondary | ICD-10-CM | POA: Diagnosis not present

## 2021-06-18 DIAGNOSIS — R7309 Other abnormal glucose: Secondary | ICD-10-CM | POA: Diagnosis not present

## 2021-06-18 DIAGNOSIS — E7800 Pure hypercholesterolemia, unspecified: Secondary | ICD-10-CM | POA: Diagnosis not present

## 2021-06-18 DIAGNOSIS — I10 Essential (primary) hypertension: Secondary | ICD-10-CM | POA: Diagnosis not present

## 2021-06-18 DIAGNOSIS — R7303 Prediabetes: Secondary | ICD-10-CM | POA: Diagnosis not present

## 2021-06-20 ENCOUNTER — Other Ambulatory Visit: Payer: Self-pay | Admitting: Family Medicine

## 2021-06-20 DIAGNOSIS — M8580 Other specified disorders of bone density and structure, unspecified site: Secondary | ICD-10-CM

## 2021-06-23 ENCOUNTER — Other Ambulatory Visit: Payer: Self-pay | Admitting: Family Medicine

## 2021-06-23 DIAGNOSIS — Z1231 Encounter for screening mammogram for malignant neoplasm of breast: Secondary | ICD-10-CM

## 2021-07-08 DIAGNOSIS — H6123 Impacted cerumen, bilateral: Secondary | ICD-10-CM | POA: Diagnosis not present

## 2021-07-08 DIAGNOSIS — H903 Sensorineural hearing loss, bilateral: Secondary | ICD-10-CM | POA: Diagnosis not present

## 2021-10-22 DIAGNOSIS — H02831 Dermatochalasis of right upper eyelid: Secondary | ICD-10-CM | POA: Diagnosis not present

## 2021-10-22 DIAGNOSIS — H25813 Combined forms of age-related cataract, bilateral: Secondary | ICD-10-CM | POA: Diagnosis not present

## 2021-10-22 DIAGNOSIS — H02834 Dermatochalasis of left upper eyelid: Secondary | ICD-10-CM | POA: Diagnosis not present

## 2021-10-22 DIAGNOSIS — H04123 Dry eye syndrome of bilateral lacrimal glands: Secondary | ICD-10-CM | POA: Diagnosis not present

## 2021-12-10 ENCOUNTER — Ambulatory Visit
Admission: RE | Admit: 2021-12-10 | Discharge: 2021-12-10 | Disposition: A | Discharge status: Home / Self Care | Payer: Medicare Other | Source: Ambulatory Visit | Attending: Family Medicine | Admitting: Family Medicine

## 2021-12-10 ENCOUNTER — Other Ambulatory Visit: Payer: Self-pay

## 2021-12-10 DIAGNOSIS — M8589 Other specified disorders of bone density and structure, multiple sites: Secondary | ICD-10-CM | POA: Diagnosis not present

## 2021-12-10 DIAGNOSIS — Z1231 Encounter for screening mammogram for malignant neoplasm of breast: Secondary | ICD-10-CM | POA: Diagnosis not present

## 2021-12-10 DIAGNOSIS — Z780 Asymptomatic menopausal state: Secondary | ICD-10-CM | POA: Diagnosis not present

## 2021-12-10 DIAGNOSIS — M8580 Other specified disorders of bone density and structure, unspecified site: Secondary | ICD-10-CM

## 2021-12-19 DIAGNOSIS — E7800 Pure hypercholesterolemia, unspecified: Secondary | ICD-10-CM | POA: Diagnosis not present

## 2021-12-19 DIAGNOSIS — F419 Anxiety disorder, unspecified: Secondary | ICD-10-CM | POA: Diagnosis not present

## 2021-12-19 DIAGNOSIS — I10 Essential (primary) hypertension: Secondary | ICD-10-CM | POA: Diagnosis not present

## 2021-12-19 DIAGNOSIS — R7303 Prediabetes: Secondary | ICD-10-CM | POA: Diagnosis not present

## 2022-01-13 DIAGNOSIS — H6123 Impacted cerumen, bilateral: Secondary | ICD-10-CM | POA: Diagnosis not present

## 2022-05-27 DIAGNOSIS — Z86010 Personal history of colonic polyps: Secondary | ICD-10-CM | POA: Diagnosis not present

## 2022-05-27 DIAGNOSIS — Z85038 Personal history of other malignant neoplasm of large intestine: Secondary | ICD-10-CM | POA: Diagnosis not present

## 2022-05-27 DIAGNOSIS — K648 Other hemorrhoids: Secondary | ICD-10-CM | POA: Diagnosis not present

## 2022-05-27 DIAGNOSIS — D125 Benign neoplasm of sigmoid colon: Secondary | ICD-10-CM | POA: Diagnosis not present

## 2022-05-27 DIAGNOSIS — Z980 Intestinal bypass and anastomosis status: Secondary | ICD-10-CM | POA: Diagnosis not present

## 2022-05-27 DIAGNOSIS — Z08 Encounter for follow-up examination after completed treatment for malignant neoplasm: Secondary | ICD-10-CM | POA: Diagnosis not present

## 2022-05-27 DIAGNOSIS — D124 Benign neoplasm of descending colon: Secondary | ICD-10-CM | POA: Diagnosis not present

## 2022-05-27 DIAGNOSIS — D128 Benign neoplasm of rectum: Secondary | ICD-10-CM | POA: Diagnosis not present

## 2022-05-27 DIAGNOSIS — K5730 Diverticulosis of large intestine without perforation or abscess without bleeding: Secondary | ICD-10-CM | POA: Diagnosis not present

## 2022-05-29 DIAGNOSIS — D125 Benign neoplasm of sigmoid colon: Secondary | ICD-10-CM | POA: Diagnosis not present

## 2022-05-29 DIAGNOSIS — D128 Benign neoplasm of rectum: Secondary | ICD-10-CM | POA: Diagnosis not present

## 2022-05-29 DIAGNOSIS — D124 Benign neoplasm of descending colon: Secondary | ICD-10-CM | POA: Diagnosis not present

## 2022-06-25 DIAGNOSIS — E7800 Pure hypercholesterolemia, unspecified: Secondary | ICD-10-CM | POA: Diagnosis not present

## 2022-06-25 DIAGNOSIS — Z0000 Encounter for general adult medical examination without abnormal findings: Secondary | ICD-10-CM | POA: Diagnosis not present

## 2022-06-25 DIAGNOSIS — R7303 Prediabetes: Secondary | ICD-10-CM | POA: Diagnosis not present

## 2022-06-25 DIAGNOSIS — I10 Essential (primary) hypertension: Secondary | ICD-10-CM | POA: Diagnosis not present

## 2022-08-04 DIAGNOSIS — H9041 Sensorineural hearing loss, unilateral, right ear, with unrestricted hearing on the contralateral side: Secondary | ICD-10-CM | POA: Diagnosis not present

## 2022-08-04 DIAGNOSIS — H6123 Impacted cerumen, bilateral: Secondary | ICD-10-CM | POA: Diagnosis not present

## 2022-09-23 DIAGNOSIS — H02834 Dermatochalasis of left upper eyelid: Secondary | ICD-10-CM | POA: Diagnosis not present

## 2022-09-23 DIAGNOSIS — H02831 Dermatochalasis of right upper eyelid: Secondary | ICD-10-CM | POA: Diagnosis not present

## 2022-09-23 DIAGNOSIS — H25813 Combined forms of age-related cataract, bilateral: Secondary | ICD-10-CM | POA: Diagnosis not present

## 2022-09-23 DIAGNOSIS — H04123 Dry eye syndrome of bilateral lacrimal glands: Secondary | ICD-10-CM | POA: Diagnosis not present

## 2023-01-13 ENCOUNTER — Other Ambulatory Visit: Payer: Self-pay | Admitting: Family Medicine

## 2023-01-13 DIAGNOSIS — Z1231 Encounter for screening mammogram for malignant neoplasm of breast: Secondary | ICD-10-CM

## 2023-01-13 DIAGNOSIS — E7800 Pure hypercholesterolemia, unspecified: Secondary | ICD-10-CM | POA: Diagnosis not present

## 2023-01-13 DIAGNOSIS — K589 Irritable bowel syndrome without diarrhea: Secondary | ICD-10-CM | POA: Diagnosis not present

## 2023-01-13 DIAGNOSIS — I700 Atherosclerosis of aorta: Secondary | ICD-10-CM | POA: Diagnosis not present

## 2023-01-13 DIAGNOSIS — R7303 Prediabetes: Secondary | ICD-10-CM | POA: Diagnosis not present

## 2023-02-03 DIAGNOSIS — H6123 Impacted cerumen, bilateral: Secondary | ICD-10-CM | POA: Diagnosis not present

## 2023-03-04 ENCOUNTER — Ambulatory Visit
Admission: RE | Admit: 2023-03-04 | Discharge: 2023-03-04 | Disposition: A | Discharge status: Home / Self Care | Payer: Medicare Other | Source: Ambulatory Visit | Attending: Family Medicine | Admitting: Family Medicine

## 2023-03-04 DIAGNOSIS — Z1231 Encounter for screening mammogram for malignant neoplasm of breast: Secondary | ICD-10-CM | POA: Diagnosis not present

## 2023-03-18 DIAGNOSIS — Z124 Encounter for screening for malignant neoplasm of cervix: Secondary | ICD-10-CM | POA: Diagnosis not present

## 2023-03-18 DIAGNOSIS — Z01419 Encounter for gynecological examination (general) (routine) without abnormal findings: Secondary | ICD-10-CM | POA: Diagnosis not present

## 2023-03-18 DIAGNOSIS — M810 Age-related osteoporosis without current pathological fracture: Secondary | ICD-10-CM | POA: Insufficient documentation

## 2023-08-04 DIAGNOSIS — I10 Essential (primary) hypertension: Secondary | ICD-10-CM | POA: Diagnosis not present

## 2023-08-04 DIAGNOSIS — H9041 Sensorineural hearing loss, unilateral, right ear, with unrestricted hearing on the contralateral side: Secondary | ICD-10-CM | POA: Diagnosis not present

## 2023-08-04 DIAGNOSIS — R7303 Prediabetes: Secondary | ICD-10-CM | POA: Diagnosis not present

## 2023-08-04 DIAGNOSIS — E7800 Pure hypercholesterolemia, unspecified: Secondary | ICD-10-CM | POA: Diagnosis not present

## 2023-08-04 DIAGNOSIS — Z0000 Encounter for general adult medical examination without abnormal findings: Secondary | ICD-10-CM | POA: Diagnosis not present

## 2023-08-04 DIAGNOSIS — H6123 Impacted cerumen, bilateral: Secondary | ICD-10-CM | POA: Diagnosis not present

## 2023-08-04 DIAGNOSIS — K589 Irritable bowel syndrome without diarrhea: Secondary | ICD-10-CM | POA: Diagnosis not present

## 2023-09-24 ENCOUNTER — Emergency Department (HOSPITAL_BASED_OUTPATIENT_CLINIC_OR_DEPARTMENT_OTHER): Payer: Medicare Other | Admitting: Radiology

## 2023-09-24 ENCOUNTER — Emergency Department (HOSPITAL_BASED_OUTPATIENT_CLINIC_OR_DEPARTMENT_OTHER)
Admission: EM | Admit: 2023-09-24 | Discharge: 2023-09-24 | Disposition: A | Discharge status: Home / Self Care | Payer: Medicare Other | Attending: Emergency Medicine | Admitting: Emergency Medicine

## 2023-09-24 ENCOUNTER — Encounter (HOSPITAL_BASED_OUTPATIENT_CLINIC_OR_DEPARTMENT_OTHER): Payer: Self-pay | Admitting: Emergency Medicine

## 2023-09-24 ENCOUNTER — Emergency Department (HOSPITAL_BASED_OUTPATIENT_CLINIC_OR_DEPARTMENT_OTHER): Payer: Medicare Other

## 2023-09-24 ENCOUNTER — Other Ambulatory Visit: Payer: Self-pay

## 2023-09-24 DIAGNOSIS — S43014A Anterior dislocation of right humerus, initial encounter: Secondary | ICD-10-CM | POA: Insufficient documentation

## 2023-09-24 DIAGNOSIS — M79621 Pain in right upper arm: Secondary | ICD-10-CM | POA: Diagnosis not present

## 2023-09-24 DIAGNOSIS — S43004A Unspecified dislocation of right shoulder joint, initial encounter: Secondary | ICD-10-CM | POA: Diagnosis not present

## 2023-09-24 DIAGNOSIS — W108XXA Fall (on) (from) other stairs and steps, initial encounter: Secondary | ICD-10-CM | POA: Insufficient documentation

## 2023-09-24 DIAGNOSIS — M25511 Pain in right shoulder: Secondary | ICD-10-CM | POA: Diagnosis not present

## 2023-09-24 MED ORDER — DIAZEPAM 5 MG/ML IJ SOLN
2.5000 mg | Freq: Once | INTRAMUSCULAR | Status: AC
  Administered 2023-09-24: 2.5 mg via INTRAVENOUS
  Filled 2023-09-24: qty 2

## 2023-09-24 MED ORDER — PROPOFOL 10 MG/ML IV BOLUS
1.0000 mg/kg | Freq: Once | INTRAVENOUS | Status: AC
  Administered 2023-09-24: 72.6 mg via INTRAVENOUS
  Filled 2023-09-24: qty 20

## 2023-09-24 MED ORDER — FENTANYL CITRATE PF 50 MCG/ML IJ SOSY
50.0000 ug | PREFILLED_SYRINGE | Freq: Once | INTRAMUSCULAR | Status: AC
  Administered 2023-09-24: 50 ug via INTRAVENOUS
  Filled 2023-09-24: qty 1

## 2023-09-24 MED ORDER — TRAMADOL HCL 50 MG PO TABS
50.0000 mg | ORAL_TABLET | Freq: Four times a day (QID) | ORAL | 0 refills | Status: DC | PRN
Start: 2023-09-24 — End: 2024-10-06

## 2023-09-24 MED ORDER — CELECOXIB 200 MG PO CAPS: 200.0000 mg | ORAL_CAPSULE | Freq: Two times a day (BID) | ORAL | 0 refills | Status: DC

## 2023-09-24 NOTE — ED Notes (Addendum)
Pt alert and oriented X 4 at the time of discharge. RR even and unlabored. No acute distress noted. Pt verbalized understanding of discharge instructions as discussed. Pt ambulatory to lobby at time of discharge. Pt brought home by her daugter.

## 2023-09-24 NOTE — ED Triage Notes (Signed)
Fall last night  missed last step. Fell onto right arm pain above elbow [place] Did not hit head no loc Unable to lift arm

## 2023-09-24 NOTE — Progress Notes (Signed)
RT NOTE:  RT present for procedure. CO2 monitor in place, BMV and suction set up and at the head of the bed during procedure. RT bagged pt for appx 5 mins throughout procedure due to apnea and pt saturations dropping to 82%, pt now awake and talking with saturations of 100%, EDP and ED-PA both at bedside during bagging. No other complications noted throughout procedure. All vitals are stable at this time.

## 2023-09-24 NOTE — ED Provider Notes (Signed)
.  Sedation  Date/Time: 09/24/2023 3:29 PM  Performed by: Terald Sleeper, MD Authorized by: Terald Sleeper, MD   Consent:    Consent obtained:  Written   Consent given by:  Patient   Risks discussed:  Allergic reaction, dysrhythmia, inadequate sedation, nausea, respiratory compromise necessitating ventilatory assistance and intubation, prolonged sedation necessitating reversal, prolonged hypoxia resulting in organ damage and vomiting   Alternatives discussed:  Analgesia without sedation Universal protocol:    Procedure explained and questions answered to patient or proxy's satisfaction: yes     Relevant documents present and verified: yes     Test results available: yes     Imaging studies available: yes     Required blood products, implants, devices, and special equipment available: yes     Site/side marked: yes     Immediately prior to procedure, a time out was called: yes     Patient identity confirmed:  Arm band Indications:    Procedure performed:  Dislocation reduction   Procedure necessitating sedation performed by:  Physician performing sedation Pre-sedation assessment:    Time since last food or drink:  6 hour   ASA classification: class 2 - patient with mild systemic disease     Mallampati score:  II - soft palate, uvula, fauces visible   Neck mobility: normal     Pre-sedation assessments completed and reviewed: airway patency, cardiovascular function, hydration status, mental status, nausea/vomiting, pain level, respiratory function and temperature     Pre-sedation assessment completed:  09/24/2023 1:40 PM Immediate pre-procedure details:    Reassessment: Patient reassessed immediately prior to procedure     Reviewed: vital signs, relevant labs/tests and NPO status     Verified: bag valve mask available, emergency equipment available, intubation equipment available, IV patency confirmed, oxygen available and reversal medications available   Procedure details (see MAR  for exact dosages):    Preoxygenation:  Nasal cannula   Sedation:  Propofol   Intended level of sedation: deep   Analgesia:  None   Intra-procedure monitoring:  Blood pressure monitoring, cardiac monitor, frequent LOC assessments, frequent vital sign checks, continuous pulse oximetry and continuous capnometry   Intra-procedure events: hypercapnia     Intra-procedure management:  Airway repositioning and BVM ventilation   Total Provider sedation time (minutes):  30 Post-procedure details:    Post-sedation assessment completed:  09/24/2023 3:00 PM   Attendance: Constant attendance by certified staff until patient recovered     Recovery: Patient returned to pre-procedure baseline     Post-sedation assessments completed and reviewed: airway patency, cardiovascular function, hydration status, mental status, nausea/vomiting, pain level, respiratory function and temperature     Patient is stable for discharge or admission: yes     Procedure completion:  Tolerated well, no immediate complications     Terald Sleeper, MD 09/24/23 1531

## 2023-09-24 NOTE — Discharge Instructions (Signed)
Keep your sling on at all times unless bathing until you follow up with an orthopedist. Activity Do not lift your arm above shoulder level until your health care provider approves. Do not lift anything until your health care provider says that it is safe. Do not push or pull things until your health care provider approves. Return to your normal activities as told by your health care provider. Ask your health care provider what activities are safe for you. Perform range-of-motion exercises only as told by your health care provider. Exercise your hand by squeezing a soft ball. This helps to decrease stiffness and swelling in your hand and wrist. General instructions Do not drive while wearing a brace or sling on a hand that you use for driving. Ask your health care provider when it is safe to drive after the brace or sling is removed. Do not take baths, swim, or use a hot tub until your health care provider approves. Ask your health care provider if you may take showers. You may only be allowed to take sponge baths. Do not use any products that contain nicotine or tobacco, such as cigarettes, e-cigarettes, and chewing tobacco. These can delay healing. If you need help quitting, ask your health care provider. Keep all follow-up visits as told by your health care provider. This is important. Contact a health care provider if: Your brace or sling gets damaged. Get help right away if: Your pain gets worse rather than better. You lose feeling in your arm or hand. Your arm or hand becomes white and cold.

## 2023-09-24 NOTE — ED Provider Notes (Addendum)
[place] EMERGENCY DEPARTMENT AT [facility] Provider Note   CSN: [id number] Arrival date & time: 09/24/23  1201     History  Chief Complaint  Patient presents with   Julie Luna is a 77 y.o. female who presents emergency department chief complaint of right shoulder pain.  Patient missed the last step coming down a set of stairs and fell directly onto her shoulder last night.  [place] at [place] and [place] this morning.  Her daughter is a Engineer, civil (consulting) brought her here.  She denies head injury or back pain.  She complains of significant pain in her right shoulder but denies any numbness or tingling.   Fall       Home Medications Prior to Admission medications   Medication Sig Start Date End Date Taking? Authorizing Provider  celecoxib (CELEBREX) 200 MG capsule Take 1 capsule (200 mg total) by mouth 2 (two) times daily. 09/24/23  Yes Arthor Captain, PA-C  terbinafine (LAMISIL) 250 MG tablet Take one tablet once daily x 7 days, then repeat every month x 4 months 03/11/16   Lenn Sink, DPM      Allergies    Aspirin    Review of Systems   Review of Systems  Physical Exam Updated Vital Signs BP (!) 151/77   Pulse (!) 58   Temp 97.8 F (36.6 C)   Resp 19   Ht 5\' 2"  (1.575 m)   Wt 72.6 kg   SpO2 97%   BMI 29.26 kg/m  Physical Exam Physical Exam  Nursing note and vitals reviewed. Constitutional: She is oriented to person, place, and time. She appears well-developed and well-nourished. No distress.  HENT:  Head: Normocephalic and atraumatic.  Eyes: Conjunctivae normal and EOM are normal. Pupils are equal, round, and reactive to light. No scleral icterus.  Neck: Normal range of motion.  Cardiovascular: Normal rate, regular rhythm and normal heart sounds.  Exam reveals no gallop and no friction rub.   No murmur heard. Pulmonary/Chest: Effort normal and breath sounds normal. No respiratory distress.  Abdominal: Soft.  Bowel sounds are normal. She exhibits no distension and no mass. There is no tenderness. There is no guarding. Musculoskeletal: Right shoulder deformity with sulcus sign.  Patient holding the right arm in a splinted position.  Radial pulse 2+ Neurological: She is alert and oriented to person, place, and time.  Skin: Skin is warm and dry. She is not diaphoretic.   ED Results / Procedures / Treatments   Labs (all labs ordered are listed, but only abnormal results are displayed) Labs Reviewed - No data to display  EKG None  Radiology DG Shoulder Right Portable  Result Date: 09/24/2023 CLINICAL DATA:  Post reduction EXAM: RIGHT SHOULDER - 1 VIEW COMPARISON:  09/24/2023 FINDINGS: Reduction of previously noted anterior shoulder dislocation. AC joint is intact. Mild Hill-Sachs deformity of the humeral head. No displaced fracture fragment. IMPRESSION: Reduction of previously noted anterior shoulder dislocation. Electronically Signed   By: Jasmine Pang M.D.   On: 09/24/2023 15:59   DG Shoulder Right  Result Date: 09/24/2023 CLINICAL DATA:  Fall onto right arm last night after missing a step. Pain. EXAM: RIGHT HUMERUS - 2+ VIEW; RIGHT SHOULDER - 2+ VIEW COMPARISON:  None Available. FINDINGS: Shoulder: Anterior dislocation of the humeral head with respect to the glenoid. Possible Hill-Sachs impaction fracture to the lateral humeral head. No evidence of bony Bankart. Minor acromioclavicular spurring. Humerus: Hill-Sachs impaction  injury to the lateral humeral head. No other fracture of the humerus. Elbow alignment is grossly maintained on provided views. IMPRESSION: 1. Anterior shoulder dislocation with Hill-Sachs impaction fracture of the lateral humeral head. 2. No additional fracture of the humerus. Electronically Signed   By: Narda Rutherford M.D.   On: 09/24/2023 13:27   DG Humerus Right  Result Date: 09/24/2023 CLINICAL DATA:  Fall onto right arm last night after missing a step. Pain. EXAM:  RIGHT HUMERUS - 2+ VIEW; RIGHT SHOULDER - 2+ VIEW COMPARISON:  None Available. FINDINGS: Shoulder: Anterior dislocation of the humeral head with respect to the glenoid. Possible Hill-Sachs impaction fracture to the lateral humeral head. No evidence of bony Bankart. Minor acromioclavicular spurring. Humerus: Hill-Sachs impaction injury to the lateral humeral head. No other fracture of the humerus. Elbow alignment is grossly maintained on provided views. IMPRESSION: 1. Anterior shoulder dislocation with Hill-Sachs impaction fracture of the lateral humeral head. 2. No additional fracture of the humerus. Electronically Signed   By: Narda Rutherford M.D.   On: 09/24/2023 13:27    Procedures Reduction of dislocation  Date/Time: 09/24/2023 4:06 PM  Performed by: Arthor Captain, PA-C Authorized by: Arthor Captain, PA-C  Consent: Verbal consent obtained. Written consent obtained. Risks and benefits: risks, benefits and alternatives were discussed Consent given by: patient Patient understanding: patient states understanding of the procedure being performed Patient consent: the patient's understanding of the procedure matches consent given Procedure consent: procedure consent matches procedure scheduled Relevant documents: relevant documents present and verified Test results: test results available and properly labeled Imaging studies: imaging studies available Patient identity confirmed: arm band and hospital-assigned identification number Time out: Immediately prior to procedure a "time out" was called to verify the correct patient, procedure, equipment, support staff and site/side marked as required. Local anesthesia used: no  Anesthesia: Local anesthesia used: no  Sedation: Patient sedated: yes (see note Dr. Renaye Rakers) Vitals: Vital signs were monitored during sedation.  Patient tolerance: patient tolerated the procedure well with no immediate complications Comments: I personally visualized and  interpreted the images using our PACS system. Acute findings include:  Interval reduction of anterior dislocation R shoulder        Medications Ordered in ED Medications  diazepam (VALIUM) injection 2.5 mg (2.5 mg Intravenous Given 09/24/23 1323)  fentaNYL (SUBLIMAZE) injection 50 mcg (50 mcg Intravenous Given 09/24/23 1323)  propofol (DIPRIVAN) 10 mg/mL bolus/IV push 72.6 mg (72.6 mg Intravenous Given 09/24/23 1412)    ED Course/ Medical Decision Making/ A&P Clinical Course as of 09/24/23 1609  Fri Sep 24, 2023  1312 Visualized and interpreted a right shoulder and right humerus plain film which shows an anterior shoulder dislocation. [AH]  1431 I visualized and interpreted portable right shoulder film it appears reduced after procedure. [AH]  1528 This is a 77 year old female who presents to the ED after a mechanical fall yesterday landing on her right shoulder, found to have an anterior shoulder dislocation with a Hill-Sachs deformity.  After failed attempt for awake bedside reduction the patient was consented for propofol sedation, and under propofol sedation the shoulder was reduced in the place.  Patient was placed in an arm sling.  When she is fully awake from anesthesia and sedation I would anticipate discharge [MT]    Clinical Course User Index [AH] Arthor Captain, PA-C [MT] Renaye Rakers, Kermit Balo, MD  Medical Decision Making Patient here after fall last night with ant r shoulder dislocatio.  Successful reduction. Placed in sling immobilizer.  NVI. Patient will be discharged with anti-inflammatory medications, Ortho follow-up and return precautions.  PDMP reviewed during this encounter.   Amount and/or Complexity of Data Reviewed Radiology: ordered.  Risk Prescription drug management.           Final Clinical Impression(s) / ED Diagnoses Final diagnoses:  Anterior dislocation of right shoulder, initial encounter    Rx / DC  Orders ED Discharge Orders          Ordered    celecoxib (CELEBREX) 200 MG capsule  2 times daily        09/24/23 1604              Arthor Captain, PA-C 09/24/23 1609    Arthor Captain, PA-C 09/24/23 1627    Terald Sleeper, MD 09/24/23 1733

## 2023-09-24 NOTE — ED Notes (Signed)
Unable to rate pain during procedure due to sedation

## 2023-09-30 DIAGNOSIS — S43014A Anterior dislocation of right humerus, initial encounter: Secondary | ICD-10-CM | POA: Diagnosis not present

## 2023-10-04 ENCOUNTER — Ambulatory Visit: Payer: Medicare Other | Admitting: Orthopaedic Surgery

## 2023-10-20 DIAGNOSIS — M25511 Pain in right shoulder: Secondary | ICD-10-CM | POA: Diagnosis not present

## 2023-10-29 DIAGNOSIS — M25511 Pain in right shoulder: Secondary | ICD-10-CM | POA: Diagnosis not present

## 2023-11-12 DIAGNOSIS — M25511 Pain in right shoulder: Secondary | ICD-10-CM | POA: Diagnosis not present

## 2023-11-19 DIAGNOSIS — M25511 Pain in right shoulder: Secondary | ICD-10-CM | POA: Diagnosis not present

## 2023-11-26 DIAGNOSIS — M25511 Pain in right shoulder: Secondary | ICD-10-CM | POA: Diagnosis not present

## 2023-12-20 DIAGNOSIS — M25511 Pain in right shoulder: Secondary | ICD-10-CM | POA: Diagnosis not present

## 2023-12-28 DIAGNOSIS — M25511 Pain in right shoulder: Secondary | ICD-10-CM | POA: Diagnosis not present

## 2024-02-08 DIAGNOSIS — I10 Essential (primary) hypertension: Secondary | ICD-10-CM | POA: Diagnosis not present

## 2024-02-08 DIAGNOSIS — R7303 Prediabetes: Secondary | ICD-10-CM | POA: Diagnosis not present

## 2024-02-08 DIAGNOSIS — E7800 Pure hypercholesterolemia, unspecified: Secondary | ICD-10-CM | POA: Diagnosis not present

## 2024-02-08 DIAGNOSIS — K589 Irritable bowel syndrome without diarrhea: Secondary | ICD-10-CM | POA: Diagnosis not present

## 2024-02-14 ENCOUNTER — Other Ambulatory Visit: Payer: Self-pay | Admitting: Family Medicine

## 2024-02-14 DIAGNOSIS — Z1231 Encounter for screening mammogram for malignant neoplasm of breast: Secondary | ICD-10-CM

## 2024-02-15 ENCOUNTER — Encounter: Payer: Self-pay | Admitting: Podiatry

## 2024-02-15 ENCOUNTER — Ambulatory Visit: Admitting: Podiatry

## 2024-02-15 ENCOUNTER — Telehealth (INDEPENDENT_AMBULATORY_CARE_PROVIDER_SITE_OTHER): Payer: Self-pay | Admitting: Otolaryngology

## 2024-02-15 DIAGNOSIS — L600 Ingrowing nail: Secondary | ICD-10-CM

## 2024-02-15 DIAGNOSIS — M79674 Pain in right toe(s): Secondary | ICD-10-CM

## 2024-02-15 DIAGNOSIS — M79675 Pain in left toe(s): Secondary | ICD-10-CM

## 2024-02-15 DIAGNOSIS — B351 Tinea unguium: Secondary | ICD-10-CM

## 2024-02-15 NOTE — Telephone Encounter (Signed)
 Reminder Call:  Left voicemail w/time and location- [address] [place], [place] [address]

## 2024-02-16 ENCOUNTER — Encounter (INDEPENDENT_AMBULATORY_CARE_PROVIDER_SITE_OTHER): Payer: Self-pay

## 2024-02-16 ENCOUNTER — Ambulatory Visit (INDEPENDENT_AMBULATORY_CARE_PROVIDER_SITE_OTHER): Payer: Medicare Other | Admitting: Otolaryngology

## 2024-02-16 VITALS — BP 151/84 | HR 60 | Ht 62.0 in | Wt 161.0 lb

## 2024-02-16 DIAGNOSIS — H6123 Impacted cerumen, bilateral: Secondary | ICD-10-CM | POA: Insufficient documentation

## 2024-02-16 NOTE — Progress Notes (Signed)
  Subjective:  Patient ID: Julie Luna, female    DOB: [birth year],  MRN: [id number]  Chief Complaint  Patient presents with   Nail Problem    Patient states she did have some lazier treatment and some medication that a doctor  gave her some years ago for toe nail fungus of her right hallux. It is still giving her problems patient states and would like doctor to take a look.    78 y.o. female presents with the above complaint. History confirmed with patient.   Objective:  Physical Exam: warm, good capillary refill, no trophic changes or ulcerative lesions, normal DP and PT pulses, normal sensory exam, and dystrophic right hallux and third toenail with mycosis and fungal debris pain on palpation to lateral border.  Assessment:   1. Pain due to onychomycosis of toenails of both feet   2. Ingrowing right great toenail      Plan:  Patient was evaluated and treated and all questions answered.  Still has significant onychomycosis causing dystrophy of the nails and pain.  It does not appear that previous Lamisil and laser treatment was very effective in eliminating the fungus.  Do not think that further treatment at this point would be effective either.  Today I recommended debridement of the nails.  The affected nails were debrided in length and thickness of the right third and first nail I also debrided the lateral corner in a slant back fashion.  We discussed further options to alleviate this if this not successful is including partial permanent matricectomy, total temporary nail avulsion and total permanent nail avulsion.  She will follow-up with me as needed if it bothers her more.  Return if symptoms worsen or fail to improve.

## 2024-02-16 NOTE — Progress Notes (Signed)
 Patient ID: Julie Luna, female   DOB: Jun 17, 1946, 78 y.o.   MRN: [id number]  Follow-up: Recurrent cerumen impaction  Procedure: Bilateral cerumen disimpaction.   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   Description: The patient is placed supine on the operating table. Under the operating microscope, the right ear canal is examined and is noted to be impacted with cerumen. The cerumen is carefully removed with a combination of suction catheters, cerumen curette, and alligator forceps. After the cerumen removal, the ear canal and tympanic membrane are noted to be normal. No middle ear effusion is noted. The same procedure is then repeated on the left side without exception. The patient tolerated the procedure well.  Follow-up care:  The patient is instructed not to use Q-tips to clean the ear canals. The patient will follow up in 6 months.

## 2024-03-07 ENCOUNTER — Ambulatory Visit
Admission: RE | Admit: 2024-03-07 | Discharge: 2024-03-07 | Disposition: A | Discharge status: Home / Self Care | Source: Ambulatory Visit | Attending: Family Medicine | Admitting: Family Medicine

## 2024-03-07 DIAGNOSIS — Z1231 Encounter for screening mammogram for malignant neoplasm of breast: Secondary | ICD-10-CM | POA: Diagnosis not present

## 2024-03-16 DIAGNOSIS — H25813 Combined forms of age-related cataract, bilateral: Secondary | ICD-10-CM | POA: Diagnosis not present

## 2024-03-16 DIAGNOSIS — H02831 Dermatochalasis of right upper eyelid: Secondary | ICD-10-CM | POA: Diagnosis not present

## 2024-03-16 DIAGNOSIS — H02834 Dermatochalasis of left upper eyelid: Secondary | ICD-10-CM | POA: Diagnosis not present

## 2024-03-16 DIAGNOSIS — H04123 Dry eye syndrome of bilateral lacrimal glands: Secondary | ICD-10-CM | POA: Diagnosis not present

## 2024-03-20 DIAGNOSIS — Z1331 Encounter for screening for depression: Secondary | ICD-10-CM | POA: Diagnosis not present

## 2024-03-20 DIAGNOSIS — Z124 Encounter for screening for malignant neoplasm of cervix: Secondary | ICD-10-CM | POA: Diagnosis not present

## 2024-08-21 ENCOUNTER — Ambulatory Visit (INDEPENDENT_AMBULATORY_CARE_PROVIDER_SITE_OTHER): Admitting: Audiology

## 2024-08-21 ENCOUNTER — Encounter (INDEPENDENT_AMBULATORY_CARE_PROVIDER_SITE_OTHER): Payer: Self-pay | Admitting: Otolaryngology

## 2024-08-21 ENCOUNTER — Ambulatory Visit (INDEPENDENT_AMBULATORY_CARE_PROVIDER_SITE_OTHER): Admitting: Otolaryngology

## 2024-08-21 VITALS — BP 134/80 | HR 70

## 2024-08-21 DIAGNOSIS — H6123 Impacted cerumen, bilateral: Secondary | ICD-10-CM

## 2024-08-21 DIAGNOSIS — H9042 Sensorineural hearing loss, unilateral, left ear, with unrestricted hearing on the contralateral side: Secondary | ICD-10-CM

## 2024-08-21 DIAGNOSIS — H90A31 Mixed conductive and sensorineural hearing loss, unilateral, right ear with restricted hearing on the contralateral side: Secondary | ICD-10-CM

## 2024-08-21 DIAGNOSIS — H9041 Sensorineural hearing loss, unilateral, right ear, with unrestricted hearing on the contralateral side: Secondary | ICD-10-CM | POA: Diagnosis not present

## 2024-08-21 NOTE — Progress Notes (Unsigned)
 Patient ID: Julie Luna, female   DOB: September 05, 1946, 78 y.o.   MRN: [id number]  Follow-up: Hearing loss, recurrent cerumen impaction  HPI: The patient is a 78 year old female who returns today for her follow-up evaluation. The patient has a history of sudden asymmetric right-sided sensorineural hearing loss. She was treated with oral and topical steroids. However, she continued to have significant asymmetric right-sided hearing loss. The patient also has a history of recurrent cerumen impaction. She currently complains of clogging sensation in her ears.  She continues to have difficulty with her hearing.  She denies any recent change in her hearing.  Exam: General: Communicates without difficulty, well nourished, no acute distress. Head: Normocephalic, no evidence injury, no tenderness, facial buttresses intact without stepoff. Face/sinus: No tenderness to palpation and percussion. Facial movement is normal and symmetric. No scleral icterus, conjunctivae clear. Neuro: CN II exam reveals vision grossly intact. No nystagmus at any point of gaze. Auricles: Intact without lesions. EAC: Bilateral cerumen impaction. Nose: External evaluation reveals normal support and skin without lesions. Dorsum is intact. Anterior rhinoscopy reveals pink mucosa over anterior aspect of inferior turbinates and intact septum. No purulence noted. Oral:  Oral cavity and oropharynx are intact, symmetric, without erythema or edema. Mucosa is moist without lesions. Neck: Full range of motion without pain. There is no significant lymphadenopathy. No masses palpable. Thyroid  bed within normal limits to palpation. Parotid glands and submandibular glands equal bilaterally without mass. Trachea is midline. Neuro:  CN 2-12 grossly intact. Gait normal.   Procedure: Bilateral cerumen disimpaction.  Anesthesia: None.  Description: Under the operating microscope, the cerumen is carefully removed with a combination of cerumen currette,  alligator forceps, and suction catheters. After the cerumen is removed, the TMs are noted to be normal. No mass, erythema, or lesions. The patient tolerated the procedure well.   AUDIOMETRIC TESTING:  I reviewed the audiometric result. The test shows stable asymmetric right sensorineural hearing loss. The speech reception threshold is 75dB AD and 10dB AS. The discrimination score is 0% AD and 100% AS.   Assessment 1. Bilateral recurrent cerumen impaction. After the disimpaction procedure, both tympanic membranes and middle ear spaces are noted to be normal.  2. Stable asymmetric right ear sensorineural hearing loss.   Plan: 1. Otomicroscopy with bilateral cerumen disimpaction.  2. The physical exam findings and the hearing test results are reviewed with the patient.  3. The patient is reassured that her hearing thresholds are stable.  4. The patient will return for re-evaluation in 6 months.

## 2024-08-21 NOTE — Progress Notes (Signed)
  [address] [place], [place] [address] [phone]  Audiological Evaluation    Name: Julie Luna     DOB:   10-Nov-1946      MRN:   [id number]                                                                                     Service Date: 08/21/2024     Accompanied by: unaccompanied   Patient comes today after Dr. Karis, ENT sent a referral for a hearing evaluation due to concerns with hearing loss.   Symptoms Yes Details  Hearing loss  [x]  Patient reports longstanding right hearing asymmetry after she experienced a sudden hearing loss maybe around 2014. Patient reports difficulty hearing or being aware of people waking by when they are in her right side.   Previous audiogram from 08-04-23 was completed at Dr. [facility]je[facility] clinic   Tinnitus  []    Ear pain/ infections/pressure  []    Balance problems  []    Noise exposure history  []    Previous ear surgeries  []    Family history of hearing loss  []    Amplification  []    Other  []      Otoscopy: Right ear: Non-occluding cerumen, able to visualize some tympanic membrane landmarks. Left ear:  Non-occluding cerumen, able to visualize some tympanic membrane landmarks.  Tympanometry: Right ear: Type A- Normal external ear canal volume with normal middle ear pressure and tympanic membrane compliance.  Left ear: Type Ad- Normal external ear canal volume with normal middle ear pressure and high tympanic membrane compliance.     Pure tone Audiometry: Right ear- Moderately severe mixed (only air-bone gap is at 250Hz )  hearing loss rising to mild from 409 240 3974 Hz, then moderate to moderately severe presumably sensorineural hearing loss from 6000 Hz - 800 Hz. Left ear-  Normal hearing from 409 240 3974 Hz, then mild to moderate sensorineural hearing loss from 6000 Hz - 8000 Hz.  Speech Audiometry: Right ear- Speech Reception Threshold (SRT) was obtained at 75 dBHL, with contralateral masking. Left ear-Speech Reception  Threshold (SRT) was obtained at 10 dBHL.   Word Recognition Score Tested using NU-6 (recorded) Right ear: 0% was obtained at a presentation level of 95 dBHL with contralateral masking which is deemed as  poor. Left ear: 10% was obtained at a presentation level of 100 dBHL with contralateral masking which is deemed as  excellent.   The hearing test results were completed under headphones and re-checked with inserts and results are deemed to be of good reliability. Test technique:  conventional    Impression: Hearing asymmetry continues to be observed, worse in the right ear.  Recommendations: Follow up with ENT as scheduled for today. Return for a hearing evaluation if concerns with hearing changes arise or per MD recommendation. Consider a communication needs assessment after medical clearance for hearing aids is obtained.   Tadao Emig MARIE LEROUX-MARTINEZ, AUD

## 2024-08-22 DIAGNOSIS — Z0000 Encounter for general adult medical examination without abnormal findings: Secondary | ICD-10-CM | POA: Diagnosis not present

## 2024-08-22 DIAGNOSIS — K219 Gastro-esophageal reflux disease without esophagitis: Secondary | ICD-10-CM | POA: Diagnosis not present

## 2024-08-22 DIAGNOSIS — R7303 Prediabetes: Secondary | ICD-10-CM | POA: Diagnosis not present

## 2024-08-22 DIAGNOSIS — K589 Irritable bowel syndrome without diarrhea: Secondary | ICD-10-CM | POA: Diagnosis not present

## 2024-08-22 DIAGNOSIS — I10 Essential (primary) hypertension: Secondary | ICD-10-CM | POA: Diagnosis not present

## 2024-08-22 DIAGNOSIS — N390 Urinary tract infection, site not specified: Secondary | ICD-10-CM | POA: Diagnosis not present

## 2024-08-22 DIAGNOSIS — H9041 Sensorineural hearing loss, unilateral, right ear, with unrestricted hearing on the contralateral side: Secondary | ICD-10-CM | POA: Insufficient documentation

## 2024-08-22 DIAGNOSIS — E7800 Pure hypercholesterolemia, unspecified: Secondary | ICD-10-CM | POA: Diagnosis not present

## 2024-10-05 NOTE — Progress Notes (Signed)
 New Patient Evaluation and Consultation  Referring Provider: Arloa Elsie SAUNDERS, MD PCP: Arloa Elsie SAUNDERS, MD Date of Service: 10/06/2024  SUBJECTIVE Chief Complaint: New Patient (Initial Visit) (Julie Luna is a 78 y.o. female here today for pelvic floor and urinary frequency.)  History of Present Illness: Julie Luna is a 78 y.o. White or Caucasian female seen in consultation at the request of Dr Arloa for evaluation of pelvic organ prolapse and urinary incontinence.   Orange or grapefruit size vaginal bulge started around 10 years ago, reduces when she lays flat. Associated with backache and pulling sensation around 1 year ago. Denies splinting, reports intermittent sensation of incomplete emptying.  Denies vaginal bleeding, discharge, itching. Worried about bowel prep for colonoscopy due to prolapse.  Urinary leakage started 2 years ago. Reports irritation from pad use  Offered pessary by Dr. Gorge in the past  Levsin PRN for abdominal cramping triggered by spicy foods managed by Dr. Elicia Rasmussen and underwent PT 09/2023 History of D&C for fibroid removal Last Cr 0.54 in 08/04/23  Review of records significant for: Pre-DM with last HbA1C 5.5 in 08/06/23, history of adenocarcinoma s/p right hemicolectomy in 2018, GERD, vertigo  CT abd/pelvis 04/08/20 CLINICAL DATA:  Lower abdominal pain for 1 month. Previous right colectomy for colon carcinoma.   EXAM: CT ABDOMEN AND PELVIS WITH CONTRAST   TECHNIQUE: Multidetector CT imaging of the abdomen and pelvis was performed using the standard protocol following bolus administration of intravenous contrast.   CONTRAST:  100mL ISOVUE -300 IOPAMIDOL  (ISOVUE -300) INJECTION 61%   COMPARISON:  None.   FINDINGS: Lower Chest: No acute findings.   Hepatobiliary: No hepatic masses identified. Gallbladder is unremarkable. No evidence of biliary ductal dilatation.   Pancreas:  No mass or inflammatory changes.    Spleen: Within normal limits in size and appearance.   Adrenals/Urinary Tract: No masses identified. 3 mm calculus seen in lower pole of right kidney. No evidence of ureteral calculi or hydronephrosis. Pelvic organ prolapse is noted, with a large cystocele.   Stomach/Bowel: Small hiatal hernia noted. Postop changes from previous right colectomy. No evidence of obstruction, inflammatory process or abnormal fluid collections. Diverticulosis is seen mainly involving the descending and sigmoid colon, however there is no evidence of diverticulitis.   Vascular/Lymphatic: No pathologically enlarged lymph nodes. No abdominal aortic aneurysm. Aortic atherosclerosis incidentally noted.   Reproductive:  No mass or other significant abnormality.   Other:  None.   Musculoskeletal:  No suspicious bone lesions identified.   IMPRESSION: 1. No evidence of metastatic disease or other acute findings. 2. Pelvic organ prolapse, with large cystocele. 3. Colonic diverticulosis, without radiographic evidence of diverticulitis. 4. Tiny right renal calculus. No evidence of ureteral calculi or hydronephrosis. 5. Small hiatal hernia.   Aortic Atherosclerosis (ICD10-I70.0).     Electronically Signed   By: Norleen DELENA Kil M.D.   On: 04/08/2020 14:07  Urinary Symptoms: Leaks urine with lifting, going from sitting to standing, with a full bladder, with movement to the bathroom, and with urgency affecting activity level Leaks 1-2 time(s) per days with urgency, more bothersome due to larger volume leakage Leaks 3-4x/day with lifting Pad use: 2-3 liners/ mini-pads per day.   Patient is bothered by UI symptoms.  Day time voids 10-12 started 3-4 years ago.  Nocturia: 1-2 times per night to void. Avoids fluid intake around 9:30-10pm, sleeps around 11-11:30pm Denies leg swelling, snoring or OSA Takes hyzaar at different times pending activity, usually 2-3pm Voiding dysfunction:  does not empty  bladder well.   Patient does not use a catheter to empty bladder.  When urinating, patient feels a weak stream and dribbling after finishing Drinks: 16-20oz water per day, 16oz coffee, occasional tea 2-3x/month  UTIs: 0 UTI's in the last year.   Denies history of blood in urine, kidney or bladder stones, pyelonephritis, bladder cancer, and kidney cancer No results found for the last 90 days.   Pelvic Organ Prolapse Symptoms:                  Patient Admits to a feeling of a bulge the vaginal area. It has been present for 8-10 years.  Patient Admits to seeing a bulge.  This bulge is bothersome.  Bowel Symptom: Bowel movements: 2 time(s) per day Stool consistency: soft  Straining: no.  Splinting: no.  Incomplete evacuation: no.  Patient Denies accidental bowel leakage / fecal incontinence Bowel regimen: none Last colonoscopy: Results unavailable for review, pending repeat 2025 per pt [facility] Colonoscopy          Upcoming     Colonoscopy (Every 10 Years) Next due on 05/27/2032    05/27/2022  Outside Claim: PR COLSC FLX W/RMVL OF TUMOR POLYP LESION SNARE TQ   Only the first 1 history entries have been loaded, but more history exists.                Sexual Function Sexually active: no.  Sexual orientation: Straight Pain with sex: No  Pelvic Pain Admits to pelvic pain Location: lower back, soreness in the mid-section of stomach and ache in the lower pelvic area Pain occurs: intermittently after activity and after full stomach Prior pain treatment: none Improved by: Advil occasionally Worsened by: Lifting, housework and sometimes active walking    Past Medical History:  Past Medical History:  Diagnosis Date   Colon cancer (HCC)    GERD (gastroesophageal reflux disease)    Vertigo      Past Surgical History:   Past Surgical History:  Procedure Laterality Date   COLON SURGERY     LAPAROSCOPIC RIGHT COLON RESECTION     TUBAL LIGATION       Past OB/GYN History: OB History   Gravida Para Term Preterm AB Living  4 4 4   4   SAB IAB Ectopic Multiple Live Births      4    # Outcome Date GA Lbr Len/2nd Weight Sex Type Anes PTL Lv  4 Term     M Vag-Spont   LIV  3 Term     M Vag-Spont   LIV  2 Term     F Vag-Spont   LIV  1 Term     F Vag-Forceps   LIV    Vaginal deliveries: largest infant 8lb12oz, perineal laceration repaired without complications. Forceps/ Vacuum deliveries: 1, Cesarean section: 0 Menopausal: Yes, at age 57, Denies vaginal bleeding since menopause Contraception: s/p tubal ligation and menopause. Last pap smear was 03/20/24 with no results for review.  Any history of abnormal pap smears: no. No results found for: DIAGPAP, HPVHIGH, ADEQPAP  Medications: Patient has a current medication list which includes the following prescription(s): carvedilol, [START ON 10/09/2024] estradiol, hyoscyamine, losartan-hydrochlorothiazide, multiple vitamins-minerals, omeprazole, and simvastatin.   Allergies: Patient is allergic to amlodipine besylate, codeine, metoprolol, and aspirin.   Social History:  Social History   Tobacco Use   Smoking status: Never   Smokeless tobacco: Never  Vaping Use   Vaping status: Never Used  Substance Use Topics  Alcohol use: Yes    Comment: occ   Drug use: Never    Relationship status: widowed Patient lives by herself.   Patient is not employed. Regular exercise: No History of abuse: No  Family History:   Family History  Problem Relation Age of Onset   Ovarian cancer Mother    Breast cancer Maternal Aunt    Bladder Cancer Neg Hx    Renal cancer Neg Hx    Uterine cancer Neg Hx      Review of Systems: Review of Systems  Constitutional:  Positive for malaise/fatigue. Negative for fever and weight loss.  Respiratory:  Negative for cough, shortness of breath and wheezing.   Cardiovascular:  Negative for chest pain, palpitations and leg swelling.  Gastrointestinal:  Negative for abdominal pain and blood in  stool.  Genitourinary:  Positive for frequency and urgency. Negative for dysuria and hematuria.       Vaginal discharge, bulge, leakage  Skin:  Negative for rash.  Neurological:  Negative for dizziness, weakness and headaches.  Endo/Heme/Allergies:  Does not bruise/bleed easily.  Psychiatric/Behavioral:  Negative for depression. The patient is not nervous/anxious.      OBJECTIVE Physical Exam: Vitals:   10/06/24 1322  BP: (!) 145/88  Pulse: 63  Weight: 165 lb (74.8 kg)  Height: 5' 1 (1.549 m)    Physical Exam Constitutional:      General: She is not in acute distress.    Appearance: Normal appearance.  Genitourinary:     Bladder and urethral meatus normal.     No lesions in the vagina.     Right Labia: No rash, tenderness, lesions, skin changes or Bartholin's cyst.    Left Labia: No tenderness, lesions, skin changes, Bartholin's cyst or rash.    No vaginal discharge, erythema, tenderness, bleeding, ulceration or granulation tissue.     Anterior, posterior and apical vaginal prolapse present.    Severe vaginal atrophy present.     Right Adnexa: not tender, not full and no mass present.    Left Adnexa: not tender, not full and no mass present.    No cervical motion tenderness, discharge, friability, lesion, polyp or nabothian cyst.     Uterus is prolapsed.     Uterus is not enlarged, fixed, tender or irregular.     No uterine mass detected.    Urethral meatus caruncle not present.    No urethral prolapse, tenderness, mass, hypermobility, discharge or stress urinary incontinence with cough stress test present.     Bladder is not tender, urgency on palpation not present and masses not present.      Pelvic Floor: Levator muscle strength is 2/5.    Levator ani not tender, obturator internus not tender, no asymmetrical contractions present and no pelvic spasms present.    Anal wink absent and BC reflex absent.     Symmetrical pelvic sensation. Cardiovascular:     Rate and  Rhythm: Normal rate.  Pulmonary:     Effort: Pulmonary effort is normal. No respiratory distress.  Abdominal:     General: There is no distension.     Palpations: Abdomen is soft. There is no mass.     Tenderness: There is no abdominal tenderness.     Hernia: No hernia is present.  Neurological:     Mental Status: She is alert.  Vitals reviewed. Exam conducted with a chaperone present.    POP-Q:   POP-Q  3  Aa   7                                           Ba  7                                              C   6                                            Gh  2                                            Pb  7                                            tvl   3                                            Ap  7                                            Bp  7                                              D      Post-Void Residual (PVR) by Bladder Scan: In order to evaluate bladder emptying, we discussed obtaining a postvoid residual and patient agreed to this procedure.  Procedure: The ultrasound unit was placed on the patient's abdomen in the suprapubic region after the patient had voided.    Post Void Residual - 10/06/24 1421       Post Void Residual   Post Void Residual 109 mL         Straight Catheterization Procedure for PVR: After verbal consent was obtained from the patient for catheterization to assess bladder emptying and residual volume the urethra and surrounding tissues were prepped with betadine and an in and out catheterization was performed.  PVR was 80mL.  Urine appeared clear yellow. The patient tolerated the procedure well.   Laboratory Results: Lab Results  Component Value Date   COLORU yellow 10/06/2024   CLARITYU clear 10/06/2024   GLUCOSEUR negative 10/06/2024   BILIRUBINUR negative 10/06/2024   SPECGRAV 1.010 10/06/2024   RBCUR negative 10/06/2024   PHUR 6.0 10/06/2024   UROBILINOGEN 0.2  10/06/2024   LEUKOCYTESUR Negative 10/06/2024    No results found for: CREATININE  No results found for: HGBA1C  No results found for: HGB   ASSESSMENT AND PLAN Ms. Langenderfer is a 78 y.o. with:  1. Uterine procidentia   2. Urinary incontinence, mixed   3. Feeling of incomplete bladder emptying   4. Vaginal atrophy     Uterine procidentia Assessment & Plan: - For treatment of pelvic organ prolapse, we discussed options for management including expectant management, conservative management, and surgical management, such as Kegels, a pessary, pelvic floor physical therapy, and specific surgical procedures. - pt reports concerns about risk of surgery - Reviewed Vaginal closure procedure due to lack of desire for penetrative intercourse - pros - strong, good long-term success - cons - unable to have penetrative intercourse - encouraged pessary fitting to reassess urinary symptoms, pt desires to return for pessary fitting after treatment of vaginal atrophy - discussed risk of change in urinary or bowel symptoms, vaginal ulceration, discharge, bleeding, fistula formation. Explained that pt may require multiple sizes and types for fitting.    Urinary incontinence, mixed Assessment & Plan: - POCT UA negative, PVR  - urgency > stress - We discussed the symptoms of overactive bladder (OAB), which include urinary urgency, urinary frequency, nocturia, with or without urge incontinence.  While we do not know the exact etiology of OAB, several treatment options exist. We discussed management including behavioral therapy (decreasing bladder irritants, urge suppression strategies, timed voids, bladder retraining), physical therapy, medication; for refractory cases posterior tibial nerve stimulation, sacral neuromodulation, and intravesical botulinum toxin injection.  For anticholinergic medications, we discussed the potential side effects of anticholinergics including dry eyes, dry mouth,  constipation, cognitive impairment and urinary retention. For Beta-3 agonist medication, we discussed the potential side effect of elevated blood pressure which is more likely to occur in individuals with uncontrolled hypertension. - discussed risk of outlet obstruction due to stage IV prolapse, reassess urinary symptoms after reduction of prolapse - For treatment of stress urinary incontinence,  non-surgical options include expectant management, weight loss, physical therapy, as well as a pessary.  Surgical options include a midurethral sling, Burch urethropexy, and transurethral injection of a bulking agent. - start low dose vaginal estrogen  Orders: -     POCT URINALYSIS DIP (CLINITEK)  Feeling of incomplete bladder emptying Assessment & Plan: - PVR by catheterization - encouraged splinting to ensure complete emptying and avoid outlet obstruction - repeat at follow-up and reviewed signs and symptoms of urinary retention   Vaginal atrophy Assessment & Plan: - For symptomatic vaginal atrophy options include lubrication with a water-based lubricant, personal hygiene measures and barrier protection against wetness, and estrogen replacement in the form of vaginal cream, vaginal tablets, or a time-released vaginal ring.   - Rx to start low dose vaginal estrogen - discussed proper vulvar care, warm compression, avoid pad use, cotton only underwear and barrier ointment if needed  - encouraged Vit E suppository, moisturizer with Replens/Revaree   Other orders -     Estradiol; Place 0.5g nightly for two weeks then twice a week after  Dispense: 42.5 g; Refill: 3  Time spent: I spent 57 minutes dedicated to the care of this patient on the date of this encounter to include pre-visit review of records, face-to-face time with the patient discussing uterine procidentia, vaginal atrophy, mixed urinary incontinence, feeling of incomplete bladder emptying, and post visit documentation and ordering  medication/ testing.   Lianne ONEIDA Gillis, MD

## 2024-10-06 ENCOUNTER — Ambulatory Visit: Admitting: Obstetrics and Gynecology

## 2024-10-06 ENCOUNTER — Encounter: Payer: Self-pay | Admitting: Obstetrics

## 2024-10-06 ENCOUNTER — Ambulatory Visit: Admitting: Obstetrics

## 2024-10-06 VITALS — BP 145/88 | HR 63 | Ht 61.0 in | Wt 165.0 lb

## 2024-10-06 DIAGNOSIS — N952 Postmenopausal atrophic vaginitis: Secondary | ICD-10-CM | POA: Diagnosis not present

## 2024-10-06 DIAGNOSIS — K5730 Diverticulosis of large intestine without perforation or abscess without bleeding: Secondary | ICD-10-CM | POA: Insufficient documentation

## 2024-10-06 DIAGNOSIS — M8580 Other specified disorders of bone density and structure, unspecified site: Secondary | ICD-10-CM | POA: Insufficient documentation

## 2024-10-06 DIAGNOSIS — N3946 Mixed incontinence: Secondary | ICD-10-CM | POA: Diagnosis not present

## 2024-10-06 DIAGNOSIS — R3914 Feeling of incomplete bladder emptying: Secondary | ICD-10-CM

## 2024-10-06 DIAGNOSIS — D259 Leiomyoma of uterus, unspecified: Secondary | ICD-10-CM | POA: Insufficient documentation

## 2024-10-06 DIAGNOSIS — I700 Atherosclerosis of aorta: Secondary | ICD-10-CM | POA: Insufficient documentation

## 2024-10-06 DIAGNOSIS — N813 Complete uterovaginal prolapse: Secondary | ICD-10-CM | POA: Diagnosis not present

## 2024-10-06 DIAGNOSIS — N8110 Cystocele, unspecified: Secondary | ICD-10-CM | POA: Insufficient documentation

## 2024-10-06 DIAGNOSIS — E7800 Pure hypercholesterolemia, unspecified: Secondary | ICD-10-CM | POA: Insufficient documentation

## 2024-10-06 LAB — POCT URINALYSIS DIP (CLINITEK)
Bilirubin, UA: NEGATIVE
Blood, UA: NEGATIVE
Glucose, UA: NEGATIVE mg/dL
Ketones, POC UA: NEGATIVE mg/dL
Leukocytes, UA: NEGATIVE
Nitrite, UA: NEGATIVE
POC PROTEIN,UA: NEGATIVE
Spec Grav, UA: 1.01
Urobilinogen, UA: 0.2 U/dL
pH, UA: 6

## 2024-10-06 MED ORDER — ESTRADIOL 0.01 % VA CREA: TOPICAL_CREAM | VAGINAL | 3 refills | Status: AC

## 2024-10-06 NOTE — Patient Instructions (Addendum)
 You have a stage 4 (out of 4) prolapse.  We discussed the fact that it is not life threatening but there are several treatment options. For treatment of pelvic organ prolapse, we discussed options for management including expectant management, conservative management, and surgical management, such as Kegels, a pessary, pelvic floor physical therapy, and specific surgical procedures.  Start splinting (gently pushing her bulge in) to ensure complete bladder emptying  For pessary use - discussed risk of change in urinary or bowel symptoms, vaginal ulceration, discharge, bleeding, fistula formation. You may require multiple sizes and types for fitting.   For vaginal atrophy (thinning of the vaginal tissue that can cause dryness and burning) and UTI prevention we discussed estrogen replacement in the form of vaginal cream.   Start vaginal estrogen therapy nightly for two weeks then 2 times weekly at night. This can be placed with your finger or an applicator inside the vagina and around the urethra.  Please let us  know if the prescription is too expensive and we can look for alternative options.   Is vaginal estrogen therapy safe for me? Vaginal estrogen preparations act on the vaginal skin, and only a very tiny amount is absorbed into the bloodstream (0.01%).  They work in a similar way to hand or face cream.  There is minimal absorption and they are therefore perfectly safe. If you have had breast cancer and have persistent troublesome symptoms which aren't settling with vaginal moisturisers and lubricants, local estrogen treatment may be a possibility, but consultation with your oncologist should take place first.   - discussed proper vulvar care, warm compression, avoid pad use, cotton only underwear and barrier ointment if needed  - encouraged Vit E suppository, moisturizer with Replens/Revaree  We discussed the symptoms of overactive bladder (OAB), which include urinary urgency, urinary frequency,  night-time urination, with or without urge incontinence.  We discussed management including behavioral therapy (decreasing bladder irritants by following a bladder diet, urge suppression strategies, timed voids, bladder retraining), physical therapy, medication; and for refractory cases posterior tibial nerve stimulation, sacral neuromodulation, and intravesical botulinum toxin injection.   For treatment of stress urinary incontinence, which is leakage with physical activity/movement/strainging/coughing, we discussed expectant management versus nonsurgical options versus surgery. Nonsurgical options include weight loss, physical therapy, as well as a pessary.  Surgical options include a midurethral sling, which is a synthetic mesh sling that acts like a hammock under the urethra to prevent leakage of urine, a Burch urethropexy, and transurethral injection of a bulking agent.   For night time frequency: - avoid fluid intake 3 hours before bedtime

## 2024-10-07 ENCOUNTER — Encounter: Payer: Self-pay | Admitting: Obstetrics

## 2024-10-07 DIAGNOSIS — N952 Postmenopausal atrophic vaginitis: Secondary | ICD-10-CM | POA: Insufficient documentation

## 2024-10-07 DIAGNOSIS — R3914 Feeling of incomplete bladder emptying: Secondary | ICD-10-CM | POA: Insufficient documentation

## 2024-10-07 NOTE — Assessment & Plan Note (Signed)
-   POCT UA negative, PVR  - urgency > stress - We discussed the symptoms of overactive bladder (OAB), which include urinary urgency, urinary frequency, nocturia, with or without urge incontinence.  While we do not know the exact etiology of OAB, several treatment options exist. We discussed management including behavioral therapy (decreasing bladder irritants, urge suppression strategies, timed voids, bladder retraining), physical therapy, medication; for refractory cases posterior tibial nerve stimulation, sacral neuromodulation, and intravesical botulinum toxin injection.  For anticholinergic medications, we discussed the potential side effects of anticholinergics including dry eyes, dry mouth, constipation, cognitive impairment and urinary retention. For Beta-3 agonist medication, we discussed the potential side effect of elevated blood pressure which is more likely to occur in individuals with uncontrolled hypertension. - discussed risk of outlet obstruction due to stage IV prolapse, reassess urinary symptoms after reduction of prolapse - For treatment of stress urinary incontinence,  non-surgical options include expectant management, weight loss, physical therapy, as well as a pessary.  Surgical options include a midurethral sling, Burch urethropexy, and transurethral injection of a bulking agent. - start low dose vaginal estrogen

## 2024-10-07 NOTE — Assessment & Plan Note (Signed)
-   PVR by catheterization - encouraged splinting to ensure complete emptying and avoid outlet obstruction - repeat at follow-up and reviewed signs and symptoms of urinary retention

## 2024-10-07 NOTE — Assessment & Plan Note (Addendum)
-   For treatment of pelvic organ prolapse, we discussed options for management including expectant management, conservative management, and surgical management, such as Kegels, a pessary, pelvic floor physical therapy, and specific surgical procedures. - pt reports concerns about risk of surgery - Reviewed Vaginal closure procedure due to lack of desire for penetrative intercourse - pros - strong, good long-term success - cons - unable to have penetrative intercourse - encouraged pessary fitting to reassess urinary symptoms, pt desires to return for pessary fitting after treatment of vaginal atrophy - discussed risk of change in urinary or bowel symptoms, vaginal ulceration, discharge, bleeding, fistula formation. Explained that pt may require multiple sizes and types for fitting.

## 2024-10-07 NOTE — Assessment & Plan Note (Signed)
-   For symptomatic vaginal atrophy options include lubrication with a water-based lubricant, personal hygiene measures and barrier protection against wetness, and estrogen replacement in the form of vaginal cream, vaginal tablets, or a time-released vaginal ring.   - Rx to start low dose vaginal estrogen - discussed proper vulvar care, warm compression, avoid pad use, cotton only underwear and barrier ointment if needed  - encouraged Vit E suppository, moisturizer with Replens/Revaree

## 2024-11-14 ENCOUNTER — Ambulatory Visit: Admitting: Obstetrics and Gynecology

## 2024-11-14 VITALS — BP 129/71 | HR 68

## 2024-11-14 DIAGNOSIS — N813 Complete uterovaginal prolapse: Secondary | ICD-10-CM

## 2024-11-14 DIAGNOSIS — R3914 Feeling of incomplete bladder emptying: Secondary | ICD-10-CM

## 2024-11-14 DIAGNOSIS — N3946 Mixed incontinence: Secondary | ICD-10-CM

## 2024-11-14 NOTE — Progress Notes (Signed)
 [facility]   Subjective:     Chief Complaint: Pessary fitting Julie Luna is a 78 y.o. female is here for pessary fitting.)  History of Present Illness: Julie Luna is a 78 y.o. female with stage IV pelvic organ prolapse who presents today for a pessary fitting.    Past Medical History: Patient  has a past medical history of Colon cancer (HCC), GERD (gastroesophageal reflux disease), and Vertigo.   Past Surgical History: She  has a past surgical history that includes Laparoscopic right colon resection; Colon surgery; and Tubal ligation.   Medications: She has a current medication list which includes the following prescription(s): carvedilol, estradiol , hyoscyamine, losartan-hydrochlorothiazide, multiple vitamins-minerals, omeprazole, and simvastatin.   Allergies: Patient is allergic to amlodipine besylate, codeine, metoprolol, and aspirin.   Social History: Patient  reports that she has never smoked. She has never used smokeless tobacco. She reports current alcohol use. She reports that she does not use drugs.      Objective:    BP 129/71   Pulse 68  Gen: No apparent distress, A&O x 3. Pelvic Exam: Normal external female genitalia; Bartholin's and Skene's glands normal in appearance; urethral meatus normal in appearance, no urethral masses or discharge.   A size #7 long stem gellhorn (Lot [id number]) pessary was fitted. It was comfortable, stayed in place with valsalva and was an appropriate size on examination, with one finger fitting between the pessary and the vaginal walls. Patient was able to urinate and valsalva as well as cough and walk/bend without pessary expulsion. Patient was informed that with stage IV prolapse, stress leakage is more likely with pessary reducing prolapse.   Assessment/Plan:    Assessment: Ms. Mungia is a 78 y.o. with stage IV pelvic organ prolapse who presents for a pessary fitting. Plan: She was fitted with a  #7 long stem gellhorn pessary. She will keep the pessary in place until next visit. She will use estrogen X3 weekly.   Follow-up in 4 weeks for a pessary check or sooner as needed.  All questions were answered.    El Pile G Shepherd Finnan, NP

## 2024-11-21 ENCOUNTER — Telehealth: Payer: Self-pay

## 2024-11-21 NOTE — Telephone Encounter (Signed)
 Patient called yesterday, I attempted to contact her today. LVMTRC.

## 2024-11-22 ENCOUNTER — Ambulatory Visit: Admitting: Obstetrics and Gynecology

## 2024-11-22 ENCOUNTER — Encounter: Payer: Self-pay | Admitting: Obstetrics and Gynecology

## 2024-11-22 VITALS — BP 137/85 | HR 65

## 2024-11-22 DIAGNOSIS — R3914 Feeling of incomplete bladder emptying: Secondary | ICD-10-CM

## 2024-11-22 DIAGNOSIS — N813 Complete uterovaginal prolapse: Secondary | ICD-10-CM

## 2024-11-22 DIAGNOSIS — N3946 Mixed incontinence: Secondary | ICD-10-CM

## 2024-11-22 NOTE — Progress Notes (Signed)
 [facility]   Subjective:     Chief Complaint: Pessary Check (Julie Luna is a 78 y.o. female is here for pessary refitting.)  History of Present Illness: Julie Luna is a 78 y.o. female with stage IV pelvic organ prolapse who presents today for a pessary fitting. She has been using a #7 Long Stem Gellhorn pessary, but this has shifted and is causing discomfort.    Past Medical History: Patient  has a past medical history of Colon cancer (HCC), GERD (gastroesophageal reflux disease), and Vertigo.   Past Surgical History: She  has a past surgical history that includes Laparoscopic right colon resection; Colon surgery; and Tubal ligation.   Medications: She has a current medication list which includes the following prescription(s): carvedilol, estradiol , hyoscyamine, losartan-hydrochlorothiazide, multiple vitamins-minerals, omeprazole, and simvastatin.   Allergies: Patient is allergic to amlodipine besylate, codeine, metoprolol, and aspirin.   Social History: Patient  reports that she has never smoked. She has never used smokeless tobacco. She reports current alcohol use. She reports that she does not use drugs.      Objective:    BP 137/85   Pulse 65  Gen: No apparent distress, A&O x 3. Pelvic Exam: Normal external female genitalia; Bartholin's and Skene's glands normal in appearance; urethral meatus normal in appearance, no urethral masses or discharge. #7 Gellhorn pessary noted to be in vagina, but significantly dislodged from apex and twisted. #7 Gellhorn removed.   Attempted a #8 Long Stem Gellhorn pessary, it was dislodged from apex and twisted on exam similar to how the #7 was positioned when patient returned.   Attempted #6 Donut which was expelled with strong valsalva.   A size #6 cube pessary was fitted. It was comfortable, stayed in place with valsalva and was an appropriate size on examination, with one finger fitting between the  pessary and the vaginal walls. The anterior vaginal wall still bulges mildly around the pessary but I worry going bigger may irritate her tissues due to the shape of the cube. Patient was shown what the prolapse currently looked like with cube in place using a mirror and was counseled that the string on the pessary is there.    Assessment/Plan:    Assessment: Julie Luna is a 78 y.o. with stage IV pelvic organ prolapse who presents for a pessary fitting. Plan: She was fitted with a #6 cube pessary. She will keep the pessary in place until next visit. She will use estrogen.   Follow-up in 4 weeks for a pessary check or sooner as needed.  All questions were answered.    Dantonio Justen G Javar Eshbach, NP

## 2024-11-24 ENCOUNTER — Encounter: Payer: Self-pay | Admitting: Obstetrics and Gynecology

## 2024-11-24 ENCOUNTER — Ambulatory Visit: Admitting: Obstetrics and Gynecology

## 2024-11-24 VITALS — BP 150/80 | HR 67

## 2024-11-24 DIAGNOSIS — N813 Complete uterovaginal prolapse: Secondary | ICD-10-CM | POA: Diagnosis not present

## 2024-11-24 DIAGNOSIS — R3914 Feeling of incomplete bladder emptying: Secondary | ICD-10-CM | POA: Diagnosis not present

## 2024-11-24 DIAGNOSIS — N3946 Mixed incontinence: Secondary | ICD-10-CM

## 2024-11-24 DIAGNOSIS — N952 Postmenopausal atrophic vaginitis: Secondary | ICD-10-CM

## 2024-11-24 DIAGNOSIS — Z466 Encounter for fitting and adjustment of urinary device: Secondary | ICD-10-CM

## 2024-11-24 NOTE — Progress Notes (Unsigned)
 [facility]   Subjective:     Chief Complaint: Follow-up (Pessary refitting )  History of Present Illness: Julie Luna is a 78 y.o. female with stage IV pelvic organ prolapse who presents today for a pessary fitting.    Past Medical History: Patient  has a past medical history of Colon cancer (HCC), GERD (gastroesophageal reflux disease), and Vertigo.   Past Surgical History: She  has a past surgical history that includes Laparoscopic right colon resection; Colon surgery; and Tubal ligation.   Medications: She has a current medication list which includes the following prescription(s): carvedilol, estradiol , hyoscyamine, losartan-hydrochlorothiazide, multiple vitamins-minerals, omeprazole, and simvastatin.   Allergies: Patient is allergic to amlodipine besylate, codeine, metoprolol, and aspirin.   Social History: Patient  reports that she has never smoked. She has never used smokeless tobacco. She reports current alcohol use. She reports that she does not use drugs.      Objective:    BP (!) 150/80   Pulse 67  Gen: No apparent distress, A&O x 3. Pelvic Exam: Normal external female genitalia; Bartholin's and Skene's glands normal in appearance; urethral meatus normal in appearance, no urethral masses or discharge.   A size #8 cube pessary was fitted. It was comfortable, stayed in place with valsalva and was an appropriate size on examination, with one finger fitting between the pessary and the vaginal walls. Patient was comfortable and reports she was able to urinate and denied pain.    Assessment/Plan:    Assessment: Julie Luna is a 78 y.o. with stage IV pelvic organ prolapse who presents for a pessary fitting. Plan: She was fitted with a #8 cube pessary. She will keep the pessary in place until next visit. She will use estrogen.   Follow-up in 1 week for a pessary check or sooner as needed. Patient to follow up with Dr. Guadlupe to discuss surgical  options if she feels this pessary is not working well for her.  All questions were answered.    Foy Vanduyne G Raman Featherston, NP

## 2024-11-27 ENCOUNTER — Encounter: Payer: Self-pay | Admitting: Obstetrics

## 2024-11-27 ENCOUNTER — Ambulatory Visit: Admitting: Obstetrics

## 2024-11-27 VITALS — BP 150/90 | HR 60

## 2024-11-27 DIAGNOSIS — N813 Complete uterovaginal prolapse: Secondary | ICD-10-CM

## 2024-11-27 DIAGNOSIS — N952 Postmenopausal atrophic vaginitis: Secondary | ICD-10-CM

## 2024-11-27 DIAGNOSIS — N3946 Mixed incontinence: Secondary | ICD-10-CM

## 2024-11-27 DIAGNOSIS — R3914 Feeling of incomplete bladder emptying: Secondary | ICD-10-CM

## 2024-11-27 NOTE — Assessment & Plan Note (Signed)
-   For symptomatic vaginal atrophy options include lubrication with a water-based lubricant, personal hygiene measures and barrier protection against wetness, and estrogen replacement in the form of vaginal cream, vaginal tablets, or a time-released vaginal ring.   - continue low dose vaginal estrogen 1g 3x/week - discussed proper vulvar care, warm compression, avoid pad use, cotton only underwear and barrier ointment if needed  - encouraged Vit E suppository, moisturizer with Replens/Revaree

## 2024-11-27 NOTE — Assessment & Plan Note (Signed)
-   10/06/24 POCT UA negative, PVR , repeat 50mL with pessary in place - urgency > stress, worsened positional UI with pessary use - We discussed the symptoms of overactive bladder (OAB), which include urinary urgency, urinary frequency, nocturia, with or without urge incontinence.  While we do not know the exact etiology of OAB, several treatment options exist. We discussed management including behavioral therapy (decreasing bladder irritants, urge suppression strategies, timed voids, bladder retraining), physical therapy, medication; for refractory cases posterior tibial nerve stimulation, sacral neuromodulation, and intravesical botulinum toxin injection.  For anticholinergic medications, we discussed the potential side effects of anticholinergics including dry eyes, dry mouth, constipation, cognitive impairment and urinary retention. For Beta-3 agonist medication, we discussed the potential side effect of elevated blood pressure which is more likely to occur in individuals with uncontrolled hypertension. - discussed risk of outlet obstruction due to stage IV prolapse - pending urodynamics - For treatment of stress urinary incontinence,  non-surgical options include expectant management, weight loss, physical therapy, as well as a pessary.  Surgical options include a midurethral sling, Burch urethropexy, and transurethral injection of a bulking agent. - continue low dose vaginal estrogen - discussed office procedure with urethral bulking (Bulkamid). We discussed success rate of approximately 70-80% and possible need for second injection. We reviewed that this is not a permanent procedure and the Bulkamid does become less effective over time. Risks reviewed including injury to bladder or urethra, UTI, urinary retention and hematuria.  -Sling: The effectiveness of a midurethral vaginal mesh sling is approximately 85%, and thus, there will be times when you may leak urine after surgery, especially if  your bladder is full or if you have a strong cough. There is a balance between making the sling tight enough to treat your leakage but not too tight so that you have long-term difficulty emptying your bladder. A mesh sling will not directly treat overactive bladder/urge incontinence and may worsen it.  There is an FDA safety notification on vaginal mesh procedures for prolapse but NOT mesh slings. We have extensive experience and training with mesh placement and we have close postoperative follow up to identify any potential complications from mesh. It is important to realize that this mesh is a permanent implant that cannot be easily removed. There are rare risks of mesh exposure (2-4%), pain with intercourse (0-7%), and infection (<1%). The risk of mesh exposure if more likely in a woman with risks for poor healing (prior radiation, poorly controlled diabetes, or immunocompromised). The risk of new or worsened chronic pain after mesh implant is more common in women with baseline chronic pain and/or poorly controlled anxiety or depression. Approximately 2-4% of patients will experience longer-term post-operative voiding dysfunction that may require surgical revision of the sling. We also reviewed that postoperatively, her stream may not be as strong as before surgery.  - desires to proceed with  midurethral sling

## 2024-11-27 NOTE — Progress Notes (Signed)
 [facility] Return Visit  SUBJECTIVE  History of Present Illness: Julie Luna is a 78 y.o. female seen in follow-up for uterine procidentia, vaginal atrophy, mixed urinary incontinence, feeling of incomplete bladder emptying. Plan at last visit was trial of vaginal estrogen and trial of #6 cube pessary.   Failed size #7 long stem gellhorn due to discomfort Reports R sided stabbing back pain started Saturday, took Advil with improvement of pain with 1-2/10 aching.  Denies fever, N/V, hematuria, dysuria.  Reports some irritation with string from pessary  Denies taking it out herself Denies vaginal bleeding or discharge.  Reports reduction of bladder emptying and leakage with position change from sitting to standing.  Leaks 1-2 time(s) per days with urgency, more bothersome due to larger volume leakage Leaks 3-4x/day with lifting Pad use: 2-3 pads per day, increased pad size since #6 cube pessary.  Voids 10-12x/day, 2x/night Using vaginal estrogen 3x/week Reports change in bowel movement 2x/day with reduced oral intake.  Declines pessary refitting due to discomfort  Past Medical History: Patient  has a past medical history of Colon cancer (HCC), GERD (gastroesophageal reflux disease), and Vertigo.   Past Surgical History: She  has a past surgical history that includes Laparoscopic right colon resection; Colon surgery; and Tubal ligation.   Medications: She has a current medication list which includes the following prescription(s): carvedilol, estradiol , hyoscyamine, losartan-hydrochlorothiazide, multiple vitamins-minerals, omeprazole, and simvastatin.   Allergies: Patient is allergic to amlodipine besylate, codeine, metoprolol, and aspirin.   Social History: Patient  reports that she has never smoked. She has never used smokeless tobacco. She reports current alcohol use. She reports that she does not use drugs.     OBJECTIVE     Physical Exam: Vitals:    11/27/24 0822 11/27/24 0904  BP: (!) 157/93 (!) 150/90  Pulse: 69 60   Gen: No apparent distress, A&O x 3. Pelvic Exam: Normal external female genitalia; Bartholin's and Skene's glands normal in appearance; urethral meatus normal in appearance, no urethral masses or discharge. The #6 cube pessary was noted to be in place. It was removed with some discomfort and superficial abrasion noted at posterior fourchette. Speculum exam revealed no lesions in the vagina.    Straight Catheterization Procedure for PVR: After verbal consent was obtained from the patient for catheterization to assess bladder emptying and residual volume the urethra and surrounding tissues were prepped with betadine and an in and out catheterization was performed.  PVR was 50mL.  Urine appeared clear yellow. The patient tolerated the procedure well.     ASSESSMENT AND PLAN    Ms. Julie Luna is a 78 y.o. with:  1. Uterine procidentia   2. Feeling of incomplete bladder emptying   3. Urinary incontinence, mixed   4. Vaginal atrophy     Uterine procidentia Assessment & Plan: - failed size 7 long stem gellhorn and #6 cube pessary, declines additional pessary fitting due to discomfort - For treatment of pelvic organ prolapse, we discussed options for management including expectant management, conservative management, and surgical management, such as Kegels, a pessary, pelvic floor physical therapy, and specific surgical procedures. - Reviewed Vaginal closure procedure due to lack of desire for penetrative intercourse - pros - strong, good long-term success - cons - unable to have penetrative intercourse - pt desires to proceed with LeFort colpocleisis, posterior repair, perineorrhaphy  - discussed risks and benefits hysterectomy vs. Uterine preservation. Patient desires uterine preservation and denies history of abnormal pap smear or PMB. Ordered TVUS  Orders: -     [place]  PELVIS TRANSVAGINAL NON-OB (TV ONLY); Future  Feeling of  incomplete bladder emptying Assessment & Plan: - PVR by catheterization - encouraged splinting to ensure complete emptying and avoid outlet obstruction after pessary removal - urodynamics prior to surgical intervention   Urinary incontinence, mixed Assessment & Plan: - 10/06/24 POCT UA negative, PVR , repeat 50mL with pessary in place - urgency > stress, worsened positional UI with pessary use - We discussed the symptoms of overactive bladder (OAB), which include urinary urgency, urinary frequency, nocturia, with or without urge incontinence.  While we do not know the exact etiology of OAB, several treatment options exist. We discussed management including behavioral therapy (decreasing bladder irritants, urge suppression strategies, timed voids, bladder retraining), physical therapy, medication; for refractory cases posterior tibial nerve stimulation, sacral neuromodulation, and intravesical botulinum toxin injection.  For anticholinergic medications, we discussed the potential side effects of anticholinergics including dry eyes, dry mouth, constipation, cognitive impairment and urinary retention. For Beta-3 agonist medication, we discussed the potential side effect of elevated blood pressure which is more likely to occur in individuals with uncontrolled hypertension. - discussed risk of outlet obstruction due to stage IV prolapse - pending urodynamics - For treatment of stress urinary incontinence,  non-surgical options include expectant management, weight loss, physical therapy, as well as a pessary.  Surgical options include a midurethral sling, Burch urethropexy, and transurethral injection of a bulking agent. - continue low dose vaginal estrogen - discussed office procedure with urethral bulking (Bulkamid). We discussed success rate of approximately 70-80% and possible need for second injection. We reviewed that this is not a permanent procedure and the Bulkamid does become less  effective over time. Risks reviewed including injury to bladder or urethra, UTI, urinary retention and hematuria.  -Sling: The effectiveness of a midurethral vaginal mesh sling is approximately 85%, and thus, there will be times when you may leak urine after surgery, especially if your bladder is full or if you have a strong cough. There is a balance between making the sling tight enough to treat your leakage but not too tight so that you have long-term difficulty emptying your bladder. A mesh sling will not directly treat overactive bladder/urge incontinence and may worsen it.  There is an FDA safety notification on vaginal mesh procedures for prolapse but NOT mesh slings. We have extensive experience and training with mesh placement and we have close postoperative follow up to identify any potential complications from mesh. It is important to realize that this mesh is a permanent implant that cannot be easily removed. There are rare risks of mesh exposure (2-4%), pain with intercourse (0-7%), and infection (<1%). The risk of mesh exposure if more likely in a woman with risks for poor healing (prior radiation, poorly controlled diabetes, or immunocompromised). The risk of new or worsened chronic pain after mesh implant is more common in women with baseline chronic pain and/or poorly controlled anxiety or depression. Approximately 2-4% of patients will experience longer-term post-operative voiding dysfunction that may require surgical revision of the sling. We also reviewed that postoperatively, her stream may not be as strong as before surgery.  - desires to proceed with  midurethral sling  Orders: -     US  PELVIS TRANSVAGINAL NON-OB (TV ONLY); Future  Vaginal atrophy Assessment & Plan: - For symptomatic vaginal atrophy options include lubrication with a water-based lubricant, personal hygiene measures and barrier protection against wetness, and estrogen replacement in the form of vaginal cream, vaginal  tablets, or a time-released vaginal ring.   - continue low dose vaginal estrogen 1g 3x/week - discussed proper vulvar care, warm compression, avoid pad use, cotton only underwear and barrier ointment if needed  - encouraged Vit E suppository, moisturizer with Replens/Revaree   Time spent: I spent 29 minutes dedicated to the care of this patient on the date of this encounter to include pre-visit review of records, face-to-face time with the patient discussing stage IV pelvic organ prolapse, mixed urinary incontinence, vaginal atrophy, and post visit documentation and ordering medication/ testing.    Lianne ONEIDA Gillis, MD

## 2024-11-27 NOTE — Assessment & Plan Note (Signed)
-   PVR by catheterization - encouraged splinting to ensure complete emptying and avoid outlet obstruction after pessary removal - urodynamics prior to surgical intervention

## 2024-11-27 NOTE — Assessment & Plan Note (Signed)
-   failed size 7 long stem gellhorn and #6 cube pessary, declines additional pessary fitting due to discomfort - For treatment of pelvic organ prolapse, we discussed options for management including expectant management, conservative management, and surgical management, such as Kegels, a pessary, pelvic floor physical therapy, and specific surgical procedures. - Reviewed Vaginal closure procedure due to lack of desire for penetrative intercourse - pros - strong, good long-term success - cons - unable to have penetrative intercourse - pt desires to proceed with LeFort colpocleisis, posterior repair, perineorrhaphy  - discussed risks and benefits hysterectomy vs. Uterine preservation. Patient desires uterine preservation and denies history of abnormal pap smear or PMB. Ordered TVUS

## 2024-11-27 NOTE — Patient Instructions (Addendum)
-   discussed proper vulvar care, warm compression, avoid pad use, cotton only underwear and barrier ointment if needed  - encouraged Vit E suppository, moisturizer with Replens/Revaree  Continue vaginal estrogen 1g 3 times a week.   Start splinting or gently pushing your vaginal bulge to ensure complete bladder emptying.   Please present to the office with a full bladder for urodynamic testing.   We discussed 3 options for prolapse repair:  1) vaginal repair without mesh - Pros - safer, no mesh complications - Cons - not as strong as mesh repair, higher risk of recurrence  2) laparoscopic repair with mesh - Pros - stronger, better long-term success - Cons - risks of mesh implant (erosion into vagina or bladder, adhering to the rectum, pain) - these risks are lower than with a vaginal mesh but still exist  3) Vaginal closure procedure - pros - strong, good long-term success - cons - unable to have penetrative intercourse  For stress urinary leakage 1) we discussed an office procedure with urethral bulking (Bulkamid). We discussed success rate of approximately 70-80% and possible need for second injection. We reviewed that this is not a permanent procedure and the Bulkamid does become less effective over time. Risks reviewed including injury to bladder or urethra, UTI, urinary retention and hematuria.   2) Sling: The effectiveness of a midurethral vaginal mesh sling is approximately 85%, and thus, there will be times when you may leak urine after surgery, especially if your bladder is full or if you have a strong cough. There is a balance between making the sling tight enough to treat your leakage but not too tight so that you have long-term difficulty emptying your bladder. A mesh sling will not directly treat overactive bladder/urge incontinence and may worsen it.  There is an FDA safety notification on vaginal mesh procedures for prolapse but NOT mesh slings. We have extensive experience  and training with mesh placement and we have close postoperative follow up to identify any potential complications from mesh. It is important to realize that this mesh is a permanent implant that cannot be easily removed. There are rare risks of mesh exposure (2-4%), pain with intercourse (0-7%), and infection (<1%). The risk of mesh exposure if more likely in a woman with risks for poor healing (prior radiation, poorly controlled diabetes, or immunocompromised). The risk of new or worsened chronic pain after mesh implant is more common in women with baseline chronic pain and/or poorly controlled anxiety or depression. Approximately 2-4% of patients will experience longer-term post-operative voiding dysfunction that may require surgical revision of the sling. We also reviewed that postoperatively, her stream may not be as strong as before surgery.   Please call radiology at [phone] to schedule your imaging study today

## 2024-12-18 ENCOUNTER — Ambulatory Visit (HOSPITAL_COMMUNITY)
Admission: RE | Admit: 2024-12-18 | Discharge: 2024-12-18 | Disposition: A | Discharge status: Home / Self Care | Source: Ambulatory Visit | Attending: Obstetrics | Admitting: Obstetrics

## 2024-12-18 ENCOUNTER — Encounter: Payer: Self-pay | Admitting: *Deleted

## 2024-12-18 DIAGNOSIS — N3946 Mixed incontinence: Secondary | ICD-10-CM | POA: Diagnosis present

## 2024-12-18 DIAGNOSIS — N813 Complete uterovaginal prolapse: Secondary | ICD-10-CM | POA: Insufficient documentation

## 2024-12-28 ENCOUNTER — Ambulatory Visit: Admitting: Obstetrics and Gynecology

## 2025-01-03 ENCOUNTER — Encounter: Admitting: Obstetrics and Gynecology

## 2025-01-10 NOTE — Progress Notes (Unsigned)
 [facility] Return Visit  SUBJECTIVE  History of Present Illness: Julie Luna is a 79 y.o. female seen in follow-up for uterine procidentia, vaginal atrophy, mixed urinary incontinence, feeling of incomplete bladder emptying. Plan at last visit was continue vaginal estrogen and transvaginal ultrasound.   TVUS 12/18/24:  CLINICAL DATA:  stage IV pelvic organ prolapse.   EXAM: ULTRASOUND PELVIS TRANSVAGINAL   TECHNIQUE: Transvaginal ultrasound examination of the pelvis was performed including evaluation of the uterus, ovaries, adnexal regions, and pelvic cul-de-sac.   COMPARISON:  CT scan abdomen and pelvis from 04/08/2020.   FINDINGS: Uterus   Measurements: 5.1 x 5.6 x 7.6 cm = volume: 113.6 mL. No fibroids or other mass visualized.   Endometrium   In the fundal region, the technologist measured a homogeneous, hyperechoic, nonshadowing, oval area measuring up to 1.2 cm in thickness and 1.6 cm in length. This is incompletely characterized on the current exam but was likely present on the prior CT scan from 04/08/2020. The differential diagnosis is includes submucosal/intracavitary leiomyoma versus endometrial polyp.   The endometrium in the rest of the uterus is within normal limits measuring up to 2-2.5 mm in thickness.   Right ovary   The ovary is not visualized, which may be due to overlying bowel gas, patient's body habitus or technical factors.   Left ovary   The ovary is not visualized, which may be due to overlying bowel gas, patient's body habitus or technical factors.   Other findings:  No abnormal free fluid.   Prolapse of the uterus and urinary bladder with cystocele formation is seen on the prior CT scan from 04/08/2020.   IMPRESSION: 1. There is a 1.2 x 1.6 cm oval hyperechoic area in the fundal region of the uterus, which is incompletely characterized on the current exam but was likely present on the prior CT scan  from 04/08/2020. The differential diagnosis includes submucosal/intracavitary leiomyoma versus endometrial polyp. Correlate clinically to determine the need for further imaging with nonemergent MRI pelvis with contrast. 2. The ovaries are not visualized on the current exam, which may be due to overlying bowel gas, patient's body habitus or technical factors.     Electronically Signed   By: Ree Molt M.D.   On: 01/10/2025 16:33  UDS vs. Simple CMG*** Desires to proceed with lefort colpocleisis, posterior repair, perineorrhaphy, midurethral sling, and cystourethroscopy.  Declines pessary refitting due to discomfort. Failed size #7 long stem gellhorn and #6 cube pessary due to discomfort Denies fever, N/V, hematuria, dysuria.  Denies vaginal bleeding or discharge.  Leaks 1-2 time(s) per days with urgency, more bothersome due to larger volume leakage Leaks 3-4x/day with lifting Pad use: 2-3 pads per day, increased pad size since #6 cube pessary.  Voids 10-12x/day, 2x/night Using vaginal estrogen 3x/week Reports change in bowel movement 2x/day with reduced oral intake.   Past Medical History: Patient  has a past medical history of Colon cancer (HCC), GERD (gastroesophageal reflux disease), and Vertigo.   Past Surgical History: She  has a past surgical history that includes Laparoscopic right colon resection; Colon surgery; and Tubal ligation.   Medications: She has a current medication list which includes the following prescription(s): carvedilol, estradiol , hyoscyamine, losartan-hydrochlorothiazide, multiple vitamins-minerals, omeprazole, and simvastatin.   Allergies: Patient is allergic to amlodipine besylate, codeine, metoprolol, and aspirin.   Social History: Patient  reports that she has never smoked. She has never used smokeless tobacco. She reports current alcohol use. She reports that she does not use drugs.  OBJECTIVE     Physical Exam: There were no vitals  filed for this visit.  ***   ASSESSMENT AND PLAN    [name] is a 79 y.o. with:  No diagnosis found.   There are no diagnoses linked to this encounter. Time spent: I spent *** minutes dedicated to the care of this patient on the date of this encounter to include pre-visit review of records, face-to-face time with the patient discussing stage IV pelvic organ prolapse, mixed urinary incontinence, vaginal atrophy, and post visit documentation and ordering medication/ testing.    Julie ONEIDA Gillis, MD

## 2025-01-11 ENCOUNTER — Ambulatory Visit: Admitting: Obstetrics

## 2025-01-11 ENCOUNTER — Encounter: Payer: Self-pay | Admitting: Obstetrics

## 2025-01-11 VITALS — BP 165/88 | HR 68

## 2025-01-11 DIAGNOSIS — N813 Complete uterovaginal prolapse: Secondary | ICD-10-CM

## 2025-01-11 DIAGNOSIS — N3946 Mixed incontinence: Secondary | ICD-10-CM

## 2025-01-11 DIAGNOSIS — D259 Leiomyoma of uterus, unspecified: Secondary | ICD-10-CM

## 2025-01-11 NOTE — Assessment & Plan Note (Signed)
-   failed size 7 long stem gellhorn and #6 cube pessary, declines additional pessary fitting due to discomfort - For treatment of pelvic organ prolapse, we discussed options for management including expectant management, conservative management, and surgical management, such as Kegels, a pessary, pelvic floor physical therapy, and specific surgical procedures. - Reviewed Vaginal closure procedure due to lack of desire for penetrative intercourse - pros - strong, good long-term success - cons - unable to have penetrative intercourse - pt desires to proceed with obliterative procedure, posterior repair, perineorrhaphy  - discussed risks and benefits hysterectomy vs. Uterine preservation. - history of D&C, resectoscopic polypectomy with myomectomy by Dr. Gorge in 07/30/00. EMB in 07/30/00 with benign proliferative endometrium and soft muscle mixed with benign endometrial polyp  - Reviewed risks and benefits of uterine preservation vs. Hysterectomy in the setting of prior hemicolectomy and remote history of PMB. Patient desires to review options and need for additional endometrial workup prior to surgical planning at follow-up

## 2025-01-11 NOTE — Assessment & Plan Note (Signed)
-   10/06/24 POCT UA negative, PVR , repeat 50mL with pessary in place - urgency > stress, worsened positional UI with pessary use - We discussed the symptoms of overactive bladder (OAB), which include urinary urgency, urinary frequency, nocturia, with or without urge incontinence.  While we do not know the exact etiology of OAB, several treatment options exist. We discussed management including behavioral therapy (decreasing bladder irritants, urge suppression strategies, timed voids, bladder retraining), physical therapy, medication; for refractory cases posterior tibial nerve stimulation, sacral neuromodulation, and intravesical botulinum toxin injection.  For anticholinergic medications, we discussed the potential side effects of anticholinergics including dry eyes, dry mouth, constipation, cognitive impairment and urinary retention. For Beta-3 agonist medication, we discussed the potential side effect of elevated blood pressure which is more likely to occur in individuals with uncontrolled hypertension. - discussed risk of outlet obstruction due to stage IV prolapse - pending urodynamics vs. Simple CMG at follow-up at the time of EMB if she desires to proceed. - For treatment of stress urinary incontinence,  non-surgical options include expectant management, weight loss, physical therapy, as well as a pessary.  Surgical options include a midurethral sling, Burch urethropexy, and transurethral injection of a bulking agent. - continue low dose vaginal estrogen - discussed office procedure with urethral bulking (Bulkamid). We discussed success rate of approximately 70-80% and possible need for second injection. We reviewed that this is not a permanent procedure and the Bulkamid does become less effective over time. Risks reviewed including injury to bladder or urethra, UTI, urinary retention and hematuria.  -Sling: The effectiveness of a midurethral vaginal mesh sling is approximately 85%, and thus,  there will be times when you may leak urine after surgery, especially if your bladder is full or if you have a strong cough. There is a balance between making the sling tight enough to treat your leakage but not too tight so that you have long-term difficulty emptying your bladder. A mesh sling will not directly treat overactive bladder/urge incontinence and may worsen it.  There is an FDA safety notification on vaginal mesh procedures for prolapse but NOT mesh slings. We have extensive experience and training with mesh placement and we have close postoperative follow up to identify any potential complications from mesh. It is important to realize that this mesh is a permanent implant that cannot be easily removed. There are rare risks of mesh exposure (2-4%), pain with intercourse (0-7%), and infection (<1%). The risk of mesh exposure if more likely in a woman with risks for poor healing (prior radiation, poorly controlled diabetes, or immunocompromised). The risk of new or worsened chronic pain after mesh implant is more common in women with baseline chronic pain and/or poorly controlled anxiety or depression. Approximately 2-4% of patients will experience longer-term post-operative voiding dysfunction that may require surgical revision of the sling. We also reviewed that postoperatively, her stream may not be as strong as before surgery.  - desires to proceed with  midurethral sling

## 2025-01-11 NOTE — Patient Instructions (Addendum)
 Please consider your options for next steps:  1) Proceed with uterine preservation without additional work. We discussed risk of need for additional abdominal procedures in the future if you develop symptoms and inability to perform endometrial biopsy in the future.   2) Endometrial assessment: - 1) office endometrial biopsy - 2) ultrasound guided endometrial biopsy - 3) hysteroscopy, D&C which requires going to the operating room  3) Hysterectomy - risk of adhesions and bowel injury due to history of bowel resection and possible adhesions.  If you desire to proceed with endometrial biopsy, please take Ibuprofen 600mg  and Tylenol 500mg  prior to your visit.   We will also perform bladder testing at the time of your procedure.

## 2025-01-11 NOTE — Assessment & Plan Note (Signed)
-   TVUS 12/18/24 with 5.1 x 5.6 x 7.6 cm uterus and 1.2 x 1.6 cm oval hyperechoic area in the fundus similar to findings on CT in 04/08/20 suggests leiomyoma vs. Endometrial polyp - History of D&C, resectoscopic polypectomy with myomectomy by Dr. Gorge in 07/30/00 due to PMB with endometrial mass on saline sonohysterography. Noted  multiple anterior wall probable endometrial polyps and a posterior wall mass that impinges upon the fundus with consistency of a submucous fibroid and posterior wall fibroid. - EMB 07/30/00 with benign proliferative endometrium and soft muscle mixed with benign endometrial polyp noted - discussed risks and benefits of endometrial biopsy procedure and possible need for ultrasound guided EMB vs. Hysteroscopy D&C if insufficient tissue noted with EMB - patient desires to review options and possibly return for EMB after taking NSAIDs and tylenol

## 2025-01-22 ENCOUNTER — Ambulatory Visit: Admitting: Obstetrics

## 2025-01-30 ENCOUNTER — Ambulatory Visit (INDEPENDENT_AMBULATORY_CARE_PROVIDER_SITE_OTHER): Admitting: Otolaryngology
